# Patient Record
Sex: Male | Born: 1985 | Race: Black or African American | Hispanic: No | Marital: Single | Smoking: Former smoker
Health system: Southern US, Community
[De-identification: ages and names within clinical notes are randomized; demographics above are authoritative.]

## PROBLEM LIST (undated history)

## (undated) DIAGNOSIS — J45909 Unspecified asthma, uncomplicated: Secondary | ICD-10-CM

## (undated) DIAGNOSIS — H548 Legal blindness, as defined in USA: Secondary | ICD-10-CM

## (undated) HISTORY — PX: EYE SURGERY: SHX253

---

## 2006-01-01 ENCOUNTER — Emergency Department (HOSPITAL_COMMUNITY): Admission: EM | Admit: 2006-01-01 | Discharge: 2006-01-01 | Payer: Self-pay | Admitting: Emergency Medicine

## 2006-01-27 ENCOUNTER — Emergency Department (HOSPITAL_COMMUNITY): Admission: EM | Admit: 2006-01-27 | Discharge: 2006-01-27 | Payer: Self-pay | Admitting: Emergency Medicine

## 2006-05-06 ENCOUNTER — Emergency Department (HOSPITAL_COMMUNITY): Admission: EM | Admit: 2006-05-06 | Discharge: 2006-05-06 | Payer: Self-pay | Admitting: Emergency Medicine

## 2006-05-21 ENCOUNTER — Emergency Department (HOSPITAL_COMMUNITY): Admission: EM | Admit: 2006-05-21 | Discharge: 2006-05-22 | Payer: Self-pay | Admitting: Emergency Medicine

## 2006-06-18 ENCOUNTER — Emergency Department (HOSPITAL_COMMUNITY): Admission: EM | Admit: 2006-06-18 | Discharge: 2006-06-18 | Payer: Self-pay | Admitting: Emergency Medicine

## 2006-07-15 ENCOUNTER — Emergency Department (HOSPITAL_COMMUNITY): Admission: EM | Admit: 2006-07-15 | Discharge: 2006-07-15 | Payer: Self-pay | Admitting: Emergency Medicine

## 2006-09-18 ENCOUNTER — Emergency Department (HOSPITAL_COMMUNITY): Admission: EM | Admit: 2006-09-18 | Discharge: 2006-09-18 | Payer: Self-pay | Admitting: Family Medicine

## 2006-11-18 ENCOUNTER — Emergency Department (HOSPITAL_COMMUNITY): Admission: EM | Admit: 2006-11-18 | Discharge: 2006-11-18 | Payer: Self-pay | Admitting: Family Medicine

## 2006-12-08 ENCOUNTER — Emergency Department (HOSPITAL_COMMUNITY): Admission: EM | Admit: 2006-12-08 | Discharge: 2006-12-08 | Payer: Self-pay | Admitting: Emergency Medicine

## 2006-12-10 ENCOUNTER — Ambulatory Visit: Payer: Self-pay | Admitting: Family Medicine

## 2006-12-27 ENCOUNTER — Emergency Department (HOSPITAL_COMMUNITY): Admission: EM | Admit: 2006-12-27 | Discharge: 2006-12-27 | Payer: Self-pay | Admitting: Emergency Medicine

## 2007-01-07 ENCOUNTER — Ambulatory Visit: Payer: Self-pay | Admitting: Family Medicine

## 2007-07-19 IMAGING — CR DG CHEST 2V
2 series · 2 of 2 positions shown · non-contrast
Comparison: 07/15/06.

CLINICAL DATA: Shortness of breath, wheezing, asthma.
 2-VIEW CHEST RADIOGRAPH - 11/18/06:

[view not recorded (1 of 2)]
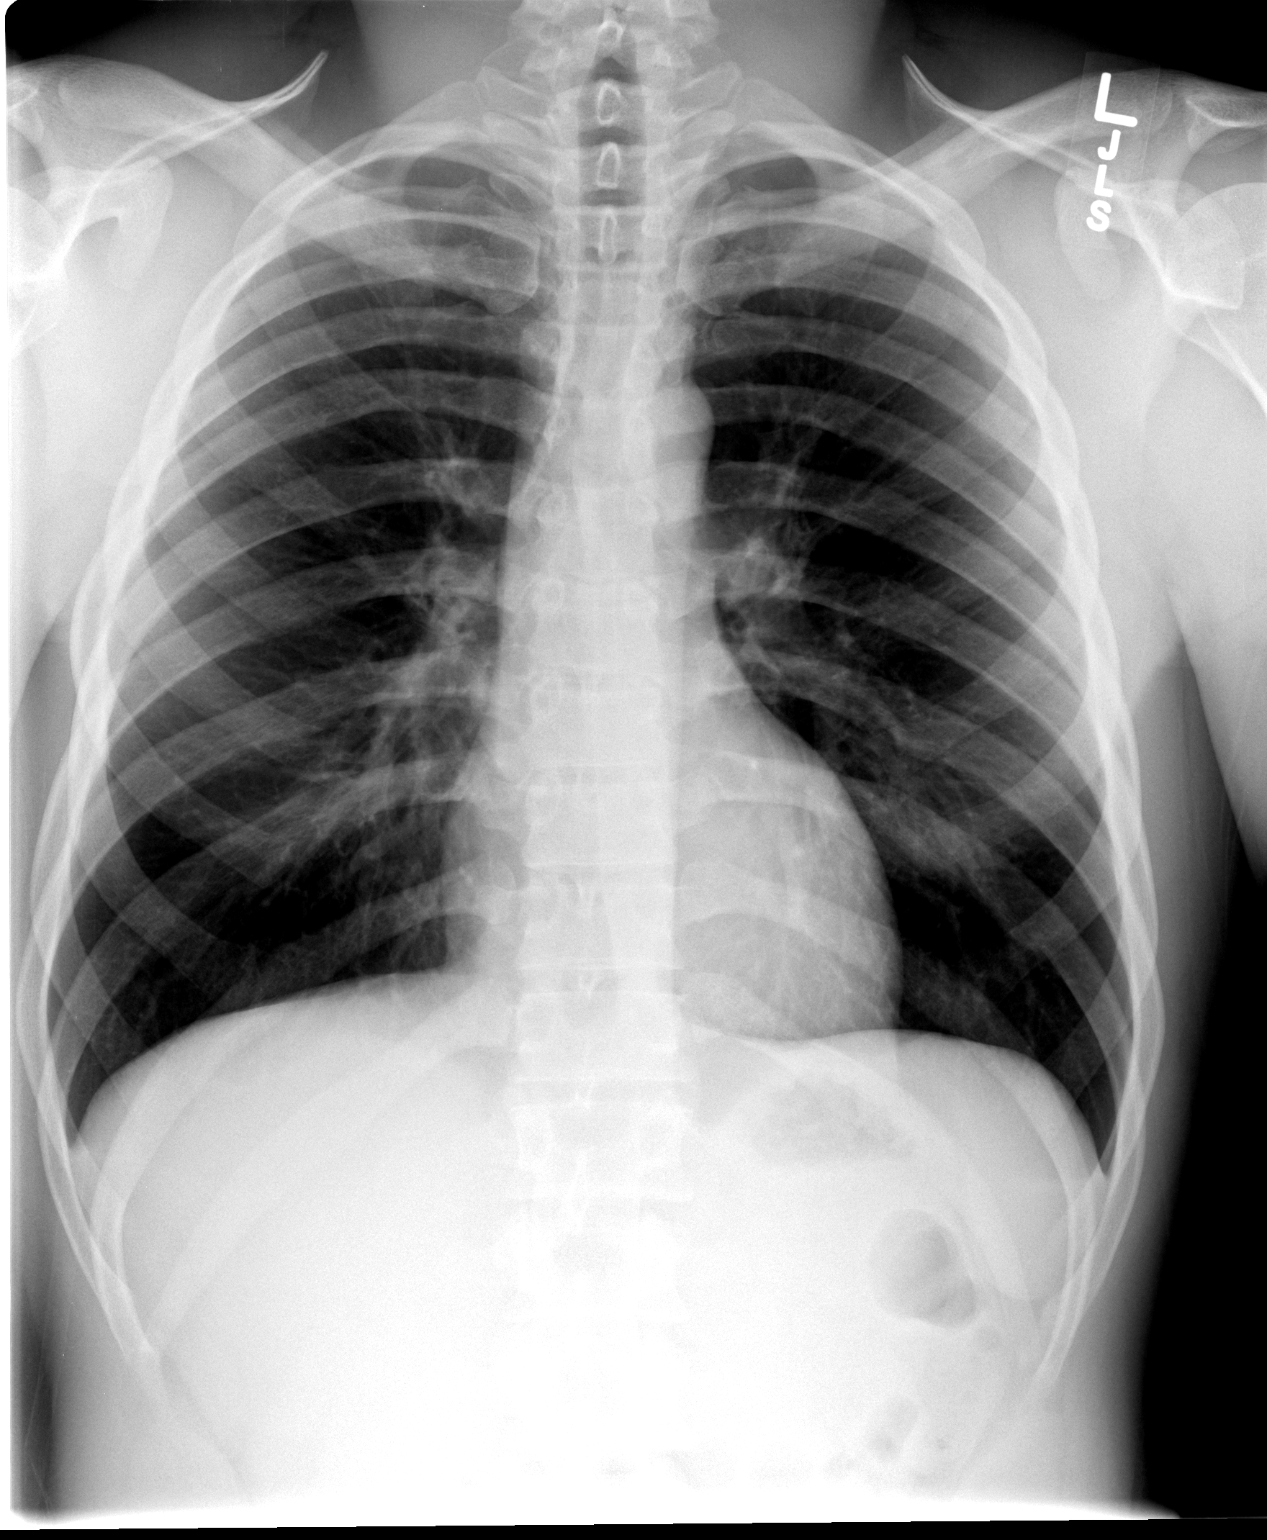

[view not recorded (2 of 2)]
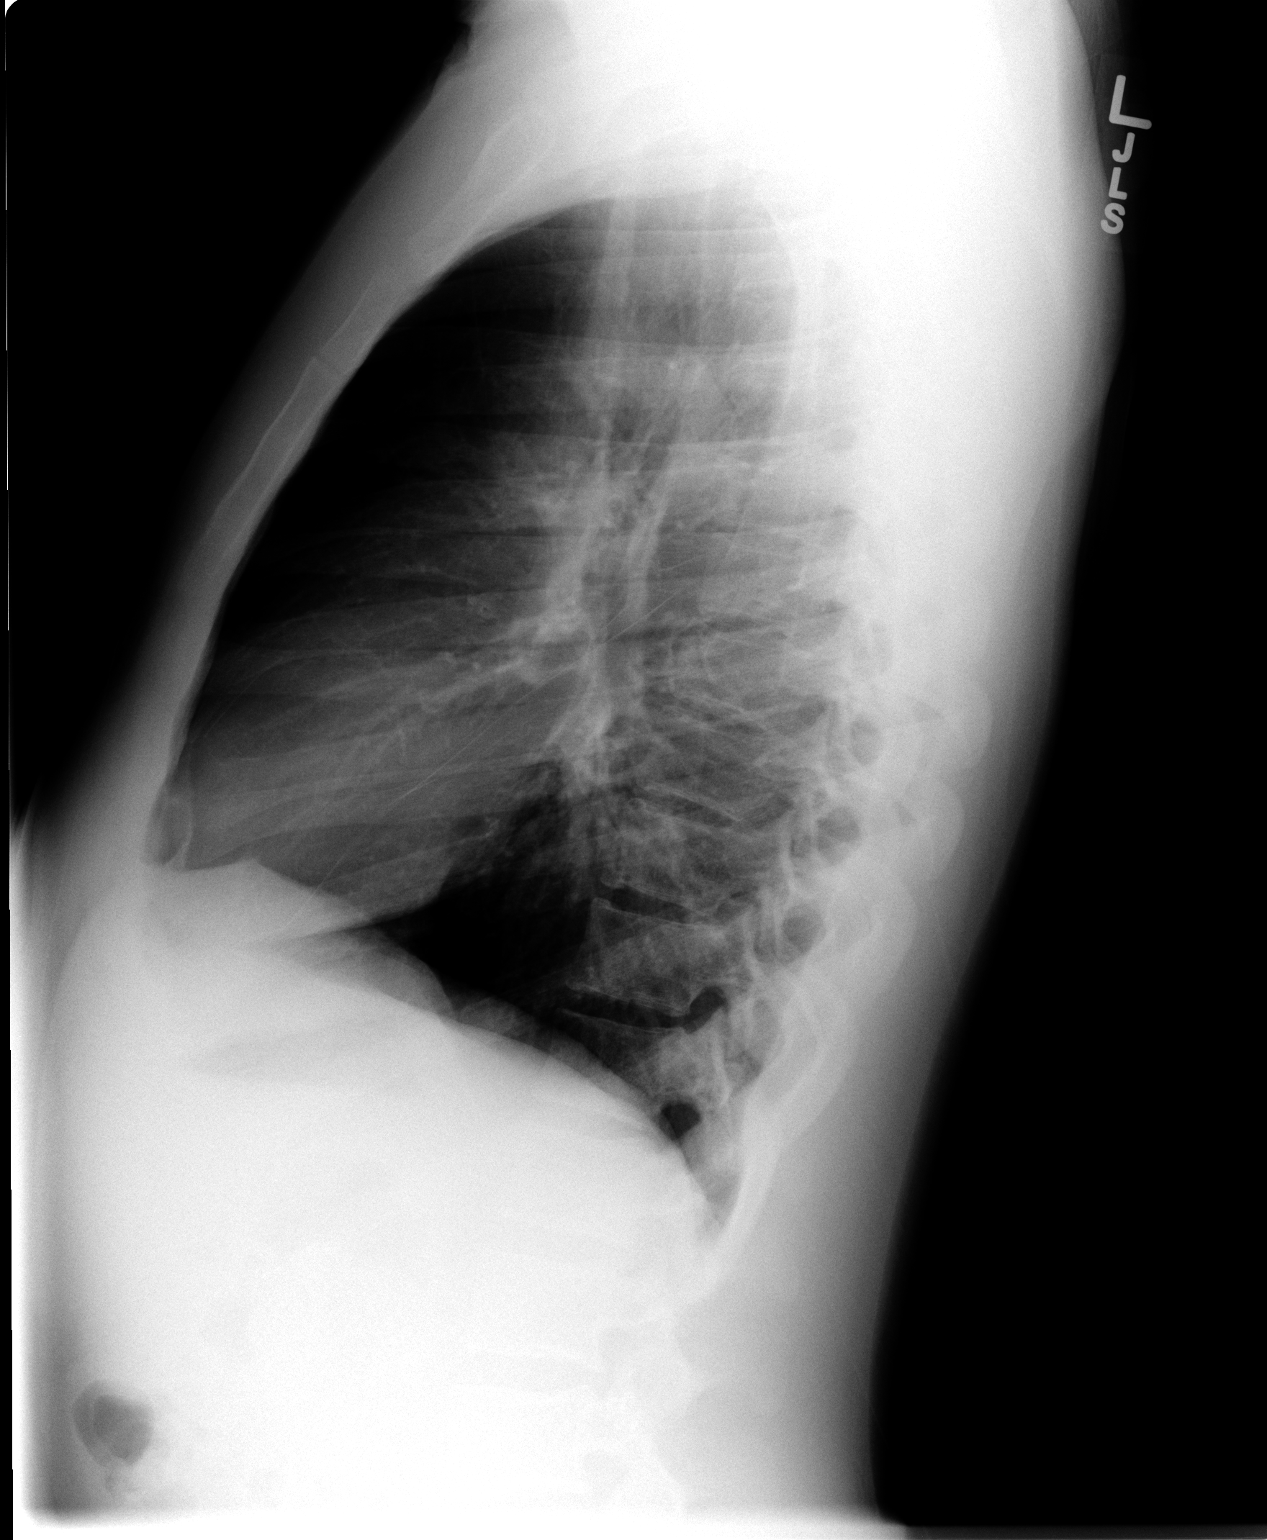

[2 of 2 positions shown; findings below may reference images not displayed]

FINDINGS: There is mild hyperinflation and central airway thickening.  No acute pneumonia, infiltrate, edema, effusion, or pneumothorax.  Stable exam.
IMPRESSION: 1.  Mild hyperinflation and airway thickening as before.
 2.  No acute air space process.

## 2007-09-23 ENCOUNTER — Emergency Department (HOSPITAL_COMMUNITY): Admission: EM | Admit: 2007-09-23 | Discharge: 2007-09-23 | Payer: Self-pay | Admitting: Family Medicine

## 2007-09-28 ENCOUNTER — Emergency Department (HOSPITAL_COMMUNITY): Admission: EM | Admit: 2007-09-28 | Discharge: 2007-09-28 | Payer: Self-pay | Admitting: Family Medicine

## 2007-10-06 ENCOUNTER — Emergency Department (HOSPITAL_COMMUNITY): Admission: EM | Admit: 2007-10-06 | Discharge: 2007-10-06 | Payer: Self-pay | Admitting: Family Medicine

## 2007-10-07 ENCOUNTER — Emergency Department (HOSPITAL_COMMUNITY): Admission: EM | Admit: 2007-10-07 | Discharge: 2007-10-07 | Payer: Self-pay | Admitting: Family Medicine

## 2007-10-15 ENCOUNTER — Emergency Department (HOSPITAL_COMMUNITY): Admission: EM | Admit: 2007-10-15 | Discharge: 2007-10-15 | Payer: Self-pay | Admitting: Emergency Medicine

## 2007-11-20 ENCOUNTER — Emergency Department (HOSPITAL_COMMUNITY): Admission: EM | Admit: 2007-11-20 | Discharge: 2007-11-21 | Payer: Self-pay | Admitting: Emergency Medicine

## 2007-12-16 ENCOUNTER — Emergency Department (HOSPITAL_COMMUNITY): Admission: EM | Admit: 2007-12-16 | Discharge: 2007-12-17 | Payer: Self-pay | Admitting: Emergency Medicine

## 2008-01-27 ENCOUNTER — Ambulatory Visit: Payer: Self-pay | Admitting: Pulmonary Disease

## 2008-01-27 ENCOUNTER — Inpatient Hospital Stay (HOSPITAL_COMMUNITY): Admission: EM | Admit: 2008-01-27 | Discharge: 2008-01-29 | Payer: Self-pay | Admitting: Emergency Medicine

## 2008-01-28 ENCOUNTER — Encounter: Payer: Self-pay | Admitting: Pulmonary Disease

## 2008-02-01 ENCOUNTER — Ambulatory Visit: Payer: Self-pay | Admitting: Internal Medicine

## 2008-02-01 DIAGNOSIS — J189 Pneumonia, unspecified organism: Secondary | ICD-10-CM | POA: Insufficient documentation

## 2008-02-01 DIAGNOSIS — J45909 Unspecified asthma, uncomplicated: Secondary | ICD-10-CM | POA: Insufficient documentation

## 2008-02-22 ENCOUNTER — Encounter: Payer: Self-pay | Admitting: Pulmonary Disease

## 2008-03-11 ENCOUNTER — Ambulatory Visit: Payer: Self-pay | Admitting: Pulmonary Disease

## 2008-06-08 ENCOUNTER — Emergency Department (HOSPITAL_COMMUNITY): Admission: EM | Admit: 2008-06-08 | Discharge: 2008-06-08 | Payer: Self-pay | Admitting: Emergency Medicine

## 2008-08-10 ENCOUNTER — Emergency Department (HOSPITAL_COMMUNITY): Admission: EM | Admit: 2008-08-10 | Discharge: 2008-08-10 | Payer: Self-pay | Admitting: Emergency Medicine

## 2008-10-10 ENCOUNTER — Telehealth (INDEPENDENT_AMBULATORY_CARE_PROVIDER_SITE_OTHER): Payer: Self-pay | Admitting: *Deleted

## 2008-10-11 ENCOUNTER — Ambulatory Visit: Payer: Self-pay | Admitting: Internal Medicine

## 2008-11-14 ENCOUNTER — Ambulatory Visit: Payer: Self-pay | Admitting: Pulmonary Disease

## 2008-12-26 ENCOUNTER — Telehealth (INDEPENDENT_AMBULATORY_CARE_PROVIDER_SITE_OTHER): Payer: Self-pay | Admitting: *Deleted

## 2009-02-06 IMAGING — CR DG CHEST 2V
2 series · 2 of 2 positions shown · non-contrast
Comparison: 01/27/2008.

CLINICAL DATA: Cough and sore throat.

CHEST - 2 VIEW

[w chest pa]
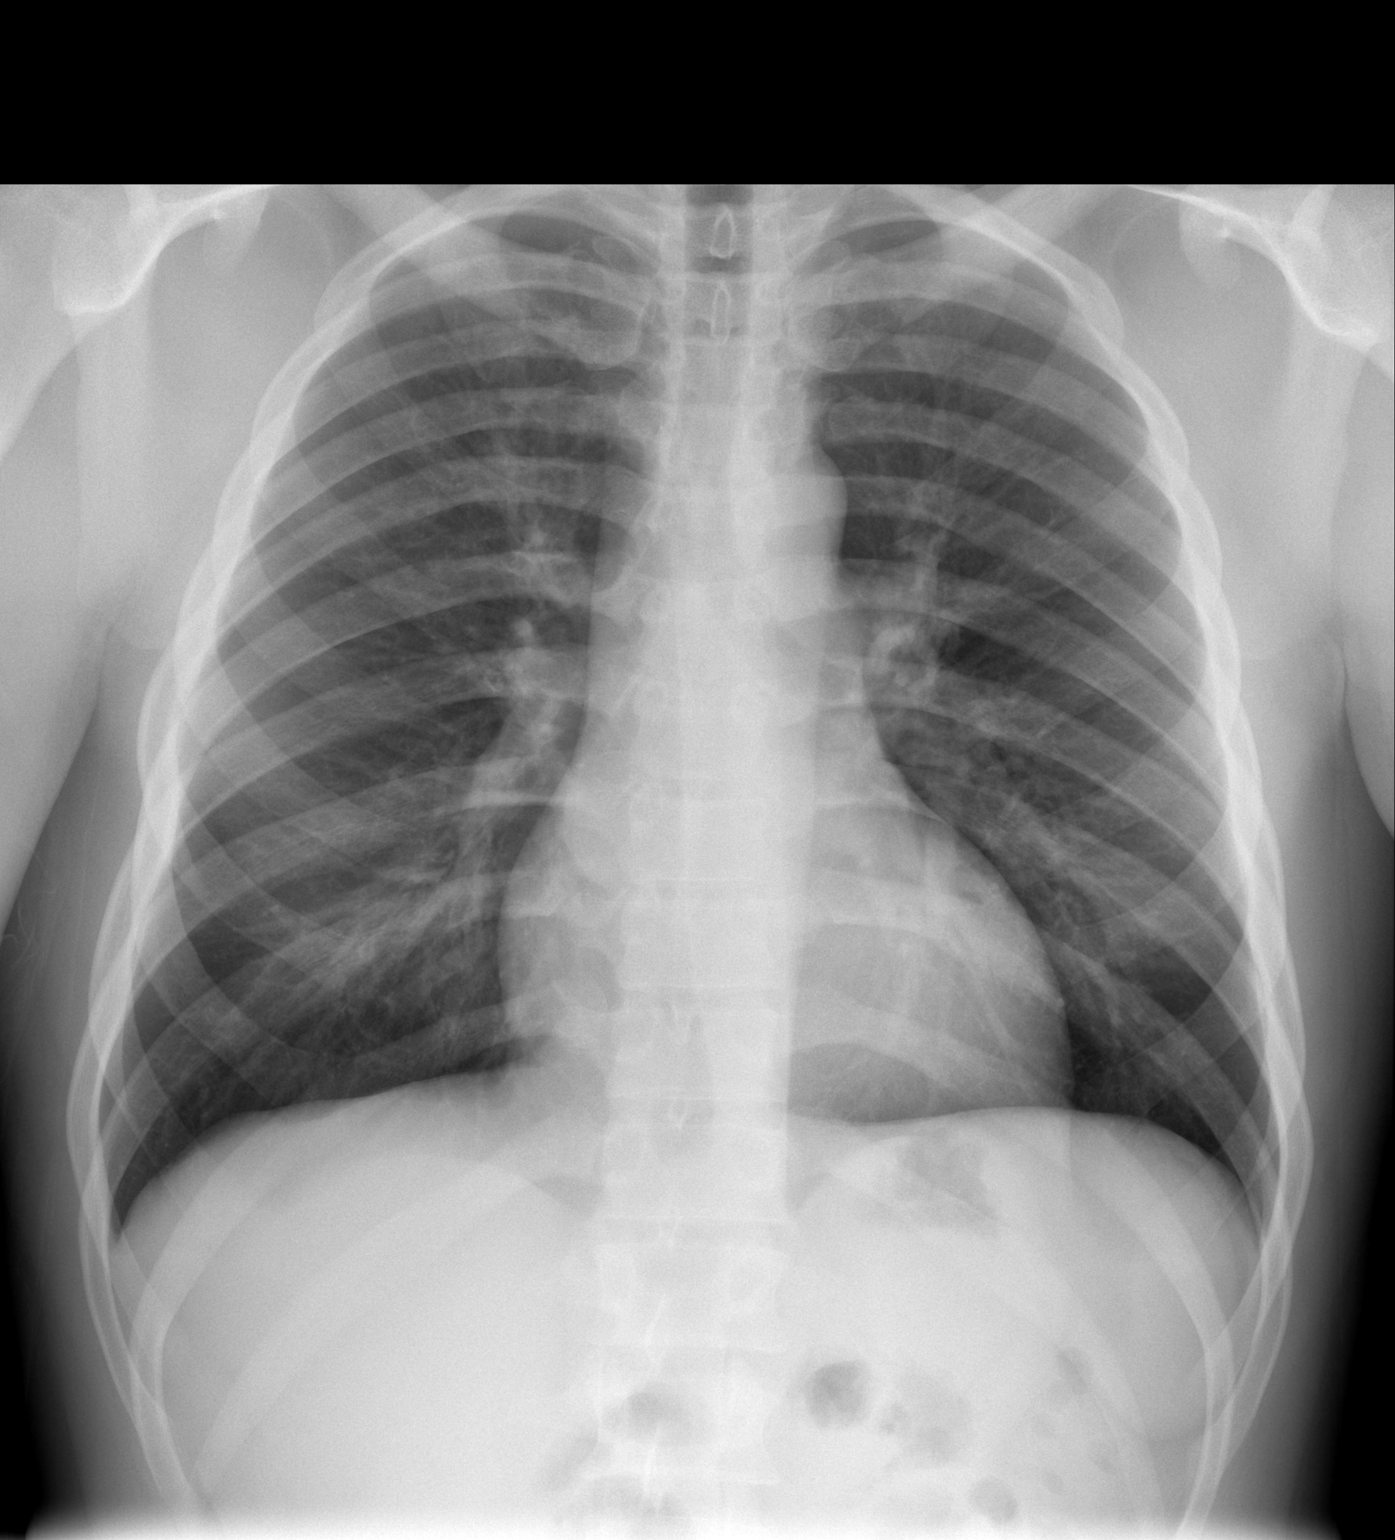

[w chest lat]
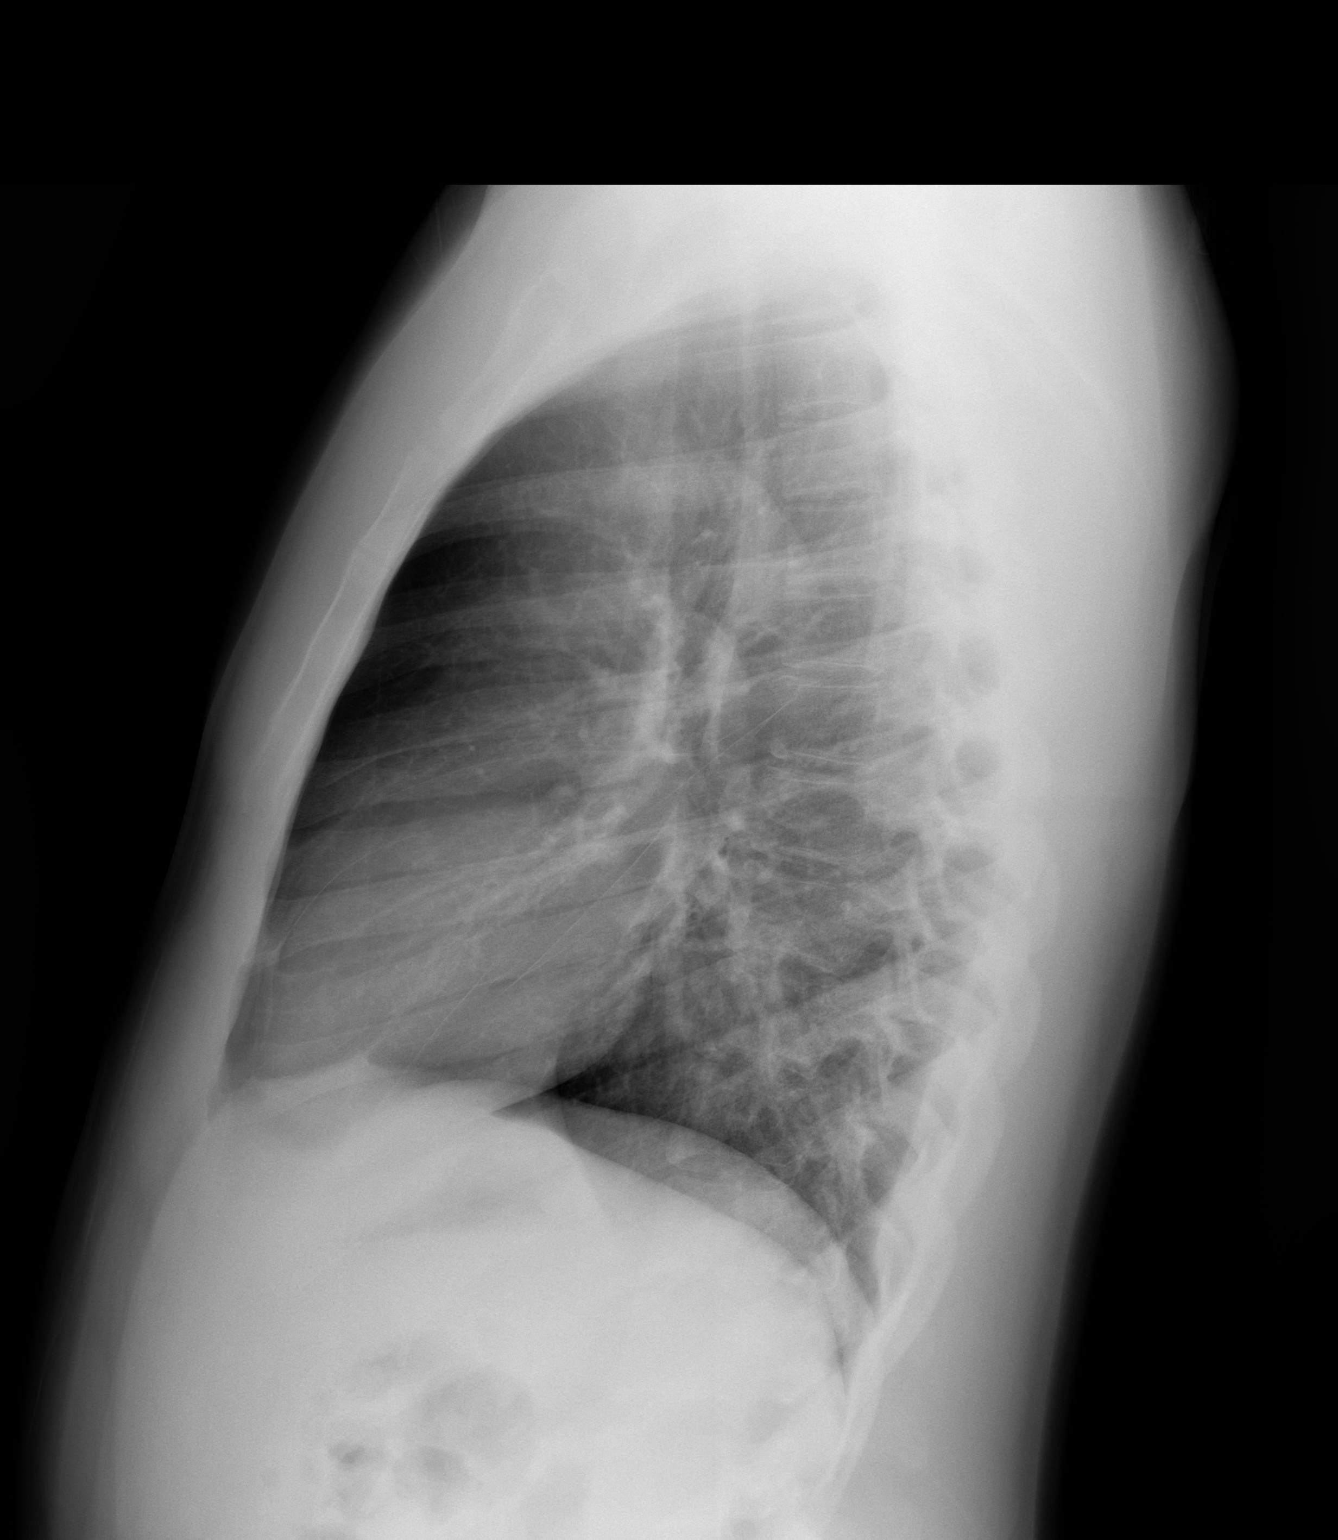

[2 of 2 positions shown; findings below may reference images not displayed]

FINDINGS: There are mild bronchitic changes noted within the right
middle lobe.  The aeration of the right middle lobe  has not
significantly changed.  The left lung is clear.  The heart and
mediastinal structures are normal.
IMPRESSION: Bronchitic changes within the right middle lobe.  The aeration of
the right middle lobe has not significantly changed.

## 2009-02-22 ENCOUNTER — Ambulatory Visit: Payer: Self-pay | Admitting: Pulmonary Disease

## 2009-04-15 ENCOUNTER — Emergency Department (HOSPITAL_COMMUNITY): Admission: EM | Admit: 2009-04-15 | Discharge: 2009-04-15 | Payer: Self-pay | Admitting: Family Medicine

## 2009-04-16 ENCOUNTER — Inpatient Hospital Stay (HOSPITAL_COMMUNITY): Admission: EM | Admit: 2009-04-16 | Discharge: 2009-04-16 | Payer: Self-pay | Admitting: Emergency Medicine

## 2009-06-03 ENCOUNTER — Emergency Department (HOSPITAL_COMMUNITY): Admission: EM | Admit: 2009-06-03 | Discharge: 2009-06-04 | Payer: Self-pay | Admitting: Emergency Medicine

## 2009-09-26 ENCOUNTER — Telehealth (INDEPENDENT_AMBULATORY_CARE_PROVIDER_SITE_OTHER): Payer: Self-pay | Admitting: *Deleted

## 2009-11-17 ENCOUNTER — Emergency Department (HOSPITAL_COMMUNITY): Admission: EM | Admit: 2009-11-17 | Discharge: 2009-11-17 | Payer: Self-pay | Admitting: Emergency Medicine

## 2010-01-02 ENCOUNTER — Ambulatory Visit: Payer: Self-pay | Admitting: Internal Medicine

## 2010-09-20 ENCOUNTER — Emergency Department (HOSPITAL_COMMUNITY)
Admission: EM | Admit: 2010-09-20 | Discharge: 2010-09-20 | Payer: Self-pay | Source: Home / Self Care | Admitting: Emergency Medicine

## 2010-10-30 ENCOUNTER — Emergency Department (HOSPITAL_COMMUNITY)
Admission: EM | Admit: 2010-10-30 | Discharge: 2010-10-31 | Payer: Self-pay | Source: Home / Self Care | Admitting: Emergency Medicine

## 2010-10-31 LAB — CBC
HCT: 47.8 % (ref 39.0–52.0)
Hemoglobin: 16.1 g/dL (ref 13.0–17.0)
MCH: 29.9 pg (ref 26.0–34.0)
MCHC: 33.7 g/dL (ref 30.0–36.0)
MCV: 88.7 fL (ref 78.0–100.0)
Platelets: 246 10*3/uL (ref 150–400)
RBC: 5.39 MIL/uL (ref 4.22–5.81)
RDW: 11.7 % (ref 11.5–15.5)
WBC: 10.4 10*3/uL (ref 4.0–10.5)

## 2010-10-31 LAB — DIFFERENTIAL
Basophils Absolute: 0.1 10*3/uL (ref 0.0–0.1)
Basophils Relative: 1 % (ref 0–1)
Eosinophils Absolute: 0.2 10*3/uL (ref 0.0–0.7)
Eosinophils Relative: 2 % (ref 0–5)
Lymphocytes Relative: 23 % (ref 12–46)
Lymphs Abs: 2.4 10*3/uL (ref 0.7–4.0)
Monocytes Absolute: 0.7 10*3/uL (ref 0.1–1.0)
Monocytes Relative: 7 % (ref 3–12)
Neutro Abs: 7.1 10*3/uL (ref 1.7–7.7)
Neutrophils Relative %: 68 % (ref 43–77)

## 2010-10-31 LAB — POCT I-STAT, CHEM 8
BUN: 22 mg/dL (ref 6–23)
Calcium, Ion: 1.17 mmol/L (ref 1.12–1.32)
Chloride: 109 mEq/L (ref 96–112)
Creatinine, Ser: 1.1 mg/dL (ref 0.4–1.5)
Glucose, Bld: 97 mg/dL (ref 70–99)
HCT: 49 % (ref 39.0–52.0)
Hemoglobin: 16.7 g/dL (ref 13.0–17.0)
Potassium: 3.8 mEq/L (ref 3.5–5.1)
Sodium: 142 mEq/L (ref 135–145)
TCO2: 25 mmol/L (ref 0–100)

## 2010-11-02 ENCOUNTER — Emergency Department (HOSPITAL_COMMUNITY)
Admission: EM | Admit: 2010-11-02 | Discharge: 2010-11-02 | Payer: Self-pay | Source: Home / Self Care | Admitting: Emergency Medicine
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
## 2010-11-13 NOTE — Assessment & Plan Note (Signed)
Summary: cough/ congested/ mbw   CC:  c/o increase asthma for 1 mth - chest tightness , sob, wheezing, and cough  - pt reports using daughter's Neb tx's.  History of Present Illness: 57 yobm never smoker with asthma since age 25.   2 exacerbations in 2009 due to poor compliance wtht inhlaed steroids. Now better control with symbicort.Has PFmeter but does not know best. 5./10 >> breathing worse, medicaid has run out, cannot get symbicort. No wheezing but feels worse.  January 02, 2010 --Presents for an acute office visit. Complains of increase asthma for 1 mth - chest tightness , sob, wheezing, cough - occas prod ( occas bloody drainage) - pt reports using daughter's Neb tx's. Started on symbicort last visit but no insurance so cant afford. He is currently applying for Medicaid, has been to [facility] previously, he does not work  b/c he says his asthma flares up too much. Denies chest pain, , orthopnea, hemoptysis, fever, n/v/d, edema, headache,recent travel or antibiotics.       Current Medications (verified): 1)  None  Allergies (verified): No Known Drug Allergies  Past History:  Past Medical History: Last updated: 02/01/2008 PNEUMONIA, RIGHT LOWER LOBE (ICD-486) ASTHMA (ICD-493.90)    Family History: Last updated: 10/11/2008 neg asthma, resp dz  Social History: Last updated: 10/11/2008 Not employed Single  no kids never smoker  Vital Signs:  Patient profile:   25 year old male Weight:      180.50 pounds O2 Sat:      98 % on Room air Temp:     100.2 degrees F oral Pulse rate:   62 / minute BP sitting:   118 / 80  (left arm) Cuff size:   regular  Vitals Entered By: Abigail Miyamoto RN (January 02, 2010 11:03 AM)  O2 Flow:  Room air   Impression & Recommendations:  Problem # 1:  ASTHMA (ICD-493.90) Exacerbation d/t medication noncompliance. We discussed several options Needs to follow up back up with [facility] on regular basis so he can get assistance  Also  meidcaid pending -this will help to cover meds.  Please contact office for sooner follow up if symptoms do not improve or worsen  2 Samples of symbicort given  REC: xopenex neb given  Prednsione taper over next week.  Begin Symbicort 160/4.26mcg 2 puffs two times a day , brush/rinse/gargle.  follow up 2-3 weeks Vassie Loll or Parrett Please contact office for sooner follow up if symptoms do not improve or worsen   Medications Added to Medication List This Visit: 1)  Prednisone 10 Mg Tabs (Prednisone) .... 4 tabs for 2 days, then 3 tabs for 2 days, 2 tabs for 2 days, then 1 tab for 2 days, then stop 2)  Symbicort 160-4.5 Mcg/act Aero (Budesonide-formoterol fumarate) .... 2 puffs two times a day  Complete Medication List: 1)  Prednisone 10 Mg Tabs (Prednisone) .... 4 tabs for 2 days, then 3 tabs for 2 days, 2 tabs for 2 days, then 1 tab for 2 days, then stop 2)  Symbicort 160-4.5 Mcg/act Aero (Budesonide-formoterol fumarate) .... 2 puffs two times a day  Other Orders: Est. Patient Level III (16109)  Patient Instructions: 1)  Prednsione taper over next week.  2)  Begin Symbicort 160/4.30mcg 2 puffs two times a day , brush/rinse/gargle.  3)  follow up 2-3 weeks Alva or Parrett 4)  Please contact office for sooner follow up if symptoms do not improve or worsen  Prescriptions: PREDNISONE 10 MG TABS (PREDNISONE) 4  tabs for 2 days, then 3 tabs for 2 days, 2 tabs for 2 days, then 1 tab for 2 days, then stop  #20 x 0   Entered and Authorized by:   Rubye Oaks NP   Signed by:   Rubye Oaks NP on 01/02/2010   Method used:   Electronically to        [facility] [address] [address] [phone]* (retail)       [address]       [place], [place]  [address]       Ph: [phone]       Fax: [phone]   RxID:   [id number]   Appended Document: Orders Update    Clinical Lists Changes  Orders: Added new Service order of Nebulizer Tx (44010) - Signed

## 2011-01-02 LAB — POCT I-STAT, CHEM 8
BUN: 16 mg/dL (ref 6–23)
Calcium, Ion: 1.1 mmol/L — ABNORMAL LOW (ref 1.12–1.32)
Chloride: 107 mEq/L (ref 96–112)
Creatinine, Ser: 1.2 mg/dL (ref 0.4–1.5)
Glucose, Bld: 125 mg/dL — ABNORMAL HIGH (ref 70–99)
HCT: 47 % (ref 39.0–52.0)
Hemoglobin: 16 g/dL (ref 13.0–17.0)
Potassium: 3.2 mEq/L — ABNORMAL LOW (ref 3.5–5.1)
Sodium: 137 mEq/L (ref 135–145)
TCO2: 22 mmol/L (ref 0–100)

## 2011-01-02 LAB — CBC
HCT: 44 % (ref 39.0–52.0)
Hemoglobin: 15.2 g/dL (ref 13.0–17.0)
MCHC: 34.6 g/dL (ref 30.0–36.0)
MCV: 89.5 fL (ref 78.0–100.0)
Platelets: 231 10*3/uL (ref 150–400)
RBC: 4.92 MIL/uL (ref 4.22–5.81)
RDW: 12.4 % (ref 11.5–15.5)
WBC: 9.1 10*3/uL (ref 4.0–10.5)

## 2011-01-02 LAB — DIFFERENTIAL
Basophils Absolute: 0 10*3/uL (ref 0.0–0.1)
Basophils Relative: 0 % (ref 0–1)
Eosinophils Absolute: 0.1 10*3/uL (ref 0.0–0.7)
Eosinophils Relative: 1 % (ref 0–5)
Lymphocytes Relative: 11 % — ABNORMAL LOW (ref 12–46)
Lymphs Abs: 1 10*3/uL (ref 0.7–4.0)
Monocytes Absolute: 1 10*3/uL (ref 0.1–1.0)
Monocytes Relative: 11 % (ref 3–12)
Neutro Abs: 7.1 10*3/uL (ref 1.7–7.7)
Neutrophils Relative %: 77 % (ref 43–77)

## 2011-01-19 LAB — RAPID STREP SCREEN (MED CTR MEBANE ONLY): Streptococcus, Group A Screen (Direct): NEGATIVE

## 2011-01-20 LAB — CBC
Hemoglobin: 15.6 g/dL (ref 13.0–17.0)
MCHC: 34 g/dL (ref 30.0–36.0)
MCV: 92.1 fL (ref 78.0–100.0)
RBC: 4.97 MIL/uL (ref 4.22–5.81)
RDW: 11.8 % (ref 11.5–15.5)

## 2011-01-20 LAB — COMPREHENSIVE METABOLIC PANEL
BUN: 16 mg/dL (ref 6–23)
CO2: 26 mEq/L (ref 19–32)
Calcium: 8.9 mg/dL (ref 8.4–10.5)
Creatinine, Ser: 0.93 mg/dL (ref 0.4–1.5)
GFR calc non Af Amer: >60 mL/min (ref >60)
Glucose, Bld: 91 mg/dL (ref 70–99)

## 2011-01-20 LAB — POCT I-STAT, CHEM 8
Chloride: 102 mEq/L (ref 96–112)
Creatinine, Ser: 1.1 mg/dL (ref 0.4–1.5)
Hemoglobin: 16.3 g/dL (ref 13.0–17.0)
Potassium: 3.5 mEq/L (ref 3.5–5.1)
Sodium: 135 mEq/L (ref 135–145)

## 2011-01-20 LAB — CULTURE, BLOOD (ROUTINE X 2): Culture: NO GROWTH

## 2011-01-20 LAB — DIFFERENTIAL
Eosinophils Absolute: 0 10*3/uL (ref 0.0–0.7)
Lymphocytes Relative: 9 % — ABNORMAL LOW (ref 12–46)
Lymphs Abs: 1.9 10*3/uL (ref 0.7–4.0)
Neutro Abs: 15.4 10*3/uL — ABNORMAL HIGH (ref 1.7–7.7)
Neutrophils Relative %: 77 % (ref 43–77)

## 2011-02-09 ENCOUNTER — Inpatient Hospital Stay (INDEPENDENT_AMBULATORY_CARE_PROVIDER_SITE_OTHER)
Admission: RE | Admit: 2011-02-09 | Discharge: 2011-02-09 | Disposition: A | Discharge status: Home / Self Care | Payer: Self-pay | Source: Ambulatory Visit | Attending: Family Medicine | Admitting: Family Medicine

## 2011-02-09 DIAGNOSIS — J45909 Unspecified asthma, uncomplicated: Secondary | ICD-10-CM

## 2011-02-26 NOTE — H&P (Signed)
NAME:  Ian Terry, SOULE NO.:  [id number]   MEDICAL RECORD NO.:  [id number]          PATIENT TYPE:  EMS   LOCATION:  MAJO                         FACILITY:  [facility]   PHYSICIAN:  Eduard Clos, MDDATE OF BIRTH:  06-10-1986   DATE OF ADMISSION:  04/16/2009  DATE OF DISCHARGE:                              HISTORY & PHYSICAL   PRIMARY PULMONOLOGIST:  Dr. Cyril Mourning.   CHIEF COMPLAINT:  Right eye pain and swelling.   HISTORY OF PRESENT ILLNESS:  A 25 year old male with a history of  bronchial asthma presented to the ER because of increasing swelling and  pain in the right eye over the last 1-1/2 days.  The patient initially  noted that there was a small pustular lesion to the right upper eyelid,  which he thought may be an insect bite, but was not sure.  He went to  the Urgent Care where he was given some amoxicillin with some eye drops,  gentamicin, despite which the patient's swelling increased fast and he  came to the ER.  In the ER, the patient had a CT of the orbit which  showed pre and postorbital cellulitis.  At this time, Dr. Elmer Picker from  Ophthalmology had come and evaluated the patient and felt that the  patient's optic nerve is not compromised and has cellulitis, which needs  IV antibiotics and based on the progression will consider p.o.  switching.   The patient has had fevers and chills off and on for the last 24 hours  and also has considerable pain in the right eye area in the surrounding  structures.  The patient denies any nausea or vomiting, loss of  consciousness, able to see with his eyes, difficult to open the right  eye, but able to easily open his left eye.  Pupils are reacting as per  the ophthalmologist.  DENIES any difficulty swallowing, denies any  shortness of breath, chest pain, palpitations, loss of consciousness, or  any neck rigidity.   PAST MEDICAL HISTORY:  Bronchial asthma.   PAST SURGICAL HISTORY:  None.   MEDICATIONS  PRIOR TO ADMISSION:  1. Symbicort 2 puffs b.i.d.  2. Albuterol HFA q.6 p.r.n.   ALLERGIES:  No known drug allergies.   FAMILY HISTORY:  Nothing contributory.   SOCIAL HISTORY:  The patient denies smoking cigarettes, drinking  alcohol, or using illegal drugs.   REVIEW OF SYSTEMS:  As per the history of present illness.  Nothing else  significant.   PHYSICAL EXAMINATION:  CONSTITUTIONAL:  The patient examined at bedside.  He appears in a little bit of pain, but he says the pain medication  helps him.  VITAL SIGNS:  Blood pressure is 120/90, pulse 70 per minute, temperature  99.1, respirations 18 per minute, O2 saturation 97%.  HEENT:  There is significant swelling of the right eye with mild  proptosis per gross examination.  At this time, I did not attempt to  open the right eye.  I discussed with Dr. Elmer Picker who did a detailed  exam.  He says the right eye vision is proper.  Left eye has no  swelling.  PERRLA.  Reactive.  There is no active discharge from the  right eye, but when his right eye was opened by his ophthalmologist,  there were some secretions.  There is also a pustular-type lesion on the  right upper eyelid.  There is no neck rigidity.  No facial asymmetry  otherwise.  CHEST:  Bilateral air entry present.  No rhonchi.  No crepitus.  HEART:  S1, S2 heard.  ABDOMEN:  Soft, nontender, bowel sounds heard.  CNS:  Alert and oriented to time, place, and person, moves upper and  lower extremities 5/5.  EXTREMITIES:  Peripheral pulses felt.  No edema.   LABORATORY STUDIES:  CT of the orbit is read as pre and postoccipital  orbital cellulitis.  No drainable fluid collection is seen.  The right  superior ophthalmic vein and cavernous sinus both appear to have  opacified.  CBC, WBC 20, hemoglobin 16.3, hematocrit 48, platelets  251,000.  Neutrophils 77%.  Comprehensive metabolic panel sodium 135,  potassium 3.5, chloride 102, carbon dioxide 26, glucose 96, BUN 15,  creatinine  1.1, alkaline phosphatase 54, AST 28, ALT 25, calcium 8.9.   ASSESSMENT:  1. Orbital cellulitis pre and postorbital right eye.  2. Bronchial asthma, stable at this time.   PLAN:  1. Will admit the patient to the stepdown unit for further      observation.  Will get blood cultures.  2. I have discussed with Dr. Elmer Picker of ophthalmology as being      recommended to get IV antibiotics.  Dr. Elmer Picker is going to order      some local antibiotics for the eye to cover the cornea.  3. I also discussed with Dr. Sampson Goon of infectious disease, who has      recommended to continue vancomycin, but to add Primaxin.  4. Will continue with asthma medications.  5. Will be repeating a CT orbit in the a.m. tomorrow as per the      recommendation of ophthalmologist to rule out any abscess      formation.  If there is any abscess formation, we will need an ENT      consult to drain the abscess.  6. We will be also getting radiologic evaluation to make sure there is      no sinus thrombosis.      Eduard Clos, MD  Electronically Signed     ANK/MEDQ  D:  04/16/2009  T:  04/16/2009  Job:  [id number]   cc:   Oretha Milch, MD

## 2011-02-26 NOTE — H&P (Signed)
NAME:  Ian Terry, Ian Terry NO.:  [id number]   MEDICAL RECORD NO.:  [id number]          PATIENT TYPE:  EMS   LOCATION:  MAJO                         FACILITY:  [facility]   PHYSICIAN:  Eduard Clos, MDDATE OF BIRTH:  August 03, 1986   DATE OF ADMISSION:  04/16/2009  DATE OF DISCHARGE:                              HISTORY & PHYSICAL   ADDENDUM:  I have discussed with the radiologist, who feels that the  patient's CAT scan was showing no signs of any deep vein thrombosis, and  at this time is not recommending any further evaluation for deep vein  thrombosis.      Eduard Clos, MD  Electronically Signed     ANK/MEDQ  D:  04/16/2009  T:  04/16/2009  Job:  [id number]

## 2011-02-26 NOTE — Consult Note (Signed)
NAME:  Ian Terry, Ian Terry NO.:  [id number]   MEDICAL RECORD NO.:  [id number]          PATIENT TYPE:  INP   LOCATION:  3713                         FACILITY:  [facility]   PHYSICIAN:  Oretha Milch, MD      DATE OF BIRTH:  11/28/1985   DATE OF CONSULTATION:  01/28/2008  DATE OF DISCHARGE:                                 CONSULTATION   REFERRING PHYSICIAN:  Dr. Brien Few.   REASON FOR CONSULTATION:  Asthma exacerbation, history of not responding  to treatment.   HISTORY OF PRESENT ILLNESS:  Ian Terry is a 25 year old African American  man who has moved down to [place], [place] from [place],  [place] to be with his 5-year-old daughter and girlfriend.  He  started with symptoms of asthma in 2004.  While he was in [place], he  describes having a pulmonary function test done there and was told that  he had asthma.  He was subsequently incarcerated in [place] and  while in jail was able to obtain his medications on a regular basis.  His symptoms were well-controlled with Advair and Singulair.  He also  has tried [employer] before.  After moving down to [facility], he has  not been able to obtain his medications since he does not have any  insurance.  He has had frequent visits to the emergency room, almost  every month, including 9 times this year alone.  His last visit was in  March 2009.  Each time, he gets a steroid taper which improves his  symptoms for 2 weeks, and then his symptoms return again.  His triggers  include weather changes, URIs and exertion.  He describes nocturnal  cough.  Prior to this exacerbation, he describes productive phlegm for 3  days.  He denies frequent chest cold, nasal congestion, fevers, weight  loss or hemoptysis.  His PPD status is unknown.   He has been able to use albuterol nebs which is prescribed for his  daughter and his girlfriend, but he himself has never had any inhalers.   PAST MEDICAL HISTORY:  Asthma.   PAST  SURGICAL HISTORY:  None.   ALLERGIES:  NONE.   CURRENT MEDICATIONS:  1. Prednisone.  2. Lovenox 40 mg subcu nightly.  3. Guaifenesin 600 mg p.o. b.i.d.  4. Xopenex and Atrovent nebs.  5. Solu-Medrol 60 mg IV q.6 h.  6. Singulair 10 mg p.o. nightly.  7. Avelox 400 mg IV q. 24.  8. Protonix 40 mg p.o. daily.   SOCIAL HISTORY:  He denies smoking, alcohol or drug abuse.  He was  incarcerated for 6 months for unclear reasons in [place].  He is  single and employed.  He lives with his girlfriend and 71-year-old  daughter.   FAMILY HISTORY:  Chronic skin disease in his mother.  No history of  premature heart disease.   PHYSICAL EXAMINATION:  GENERAL:  Adult gentleman sitting up in bed in no  apparent respiratory distress.  VITAL SIGNS:  Afebrile, heart rate 84 per minute, respirations 20 per  minute, blood pressure 128/74.  Oxygen saturation  98% room air.  HEENT:  No oral thrush.  NECK:  Supple.  No JVD, no lymphadenopathy.  CARDIOVASCULAR:  S1-S2 normal.  CHEST:  Diffuse bilateral rhonchi.  ABDOMEN:  Soft, nontender.  NEUROLOGIC:  Nonfocal.  EXTREMITIES:  No edema.   Chest x-ray shows hyperinflation, peribronchial coughing.  CT chest with  contrast today showed subtle ground-glass opacity at the left lung base  and right middle lobe bronchial wall thickening with some secretions of  the trachea.   LABORATORY DATA:  BUN/creatinine 16/1.05.  Sodium 137, potassium 4.3,  calcium 9.6, WBC count 9.9, hemoglobin 15.5, platelets 300 with 89%  segs.  Strep screen was negative.  Urine toxicology was negative.   IMPRESSION:  1. Asthma exacerbation.  2. Poorly controlled asthma due to inability to obtain medications.  3. Acute bronchitis.   The main problem here seems to be his inability to obtain medications.  He tells me that he has applied for Medicaid, and he should get this in  a few weeks.  Clearly what is more important here is to provide him with  some samples until such  time as he is able to obtain medications on his  own.  We should also write prescriptions for medications which are  cheaper and which would be covered by Medicaid.   I spent considerable time explaining to him the difference between  control and rescue medications.  I explained the need to take his  steroid inhaler all the time, even when he was asymptomatic to keep his  asthma under control.   RECOMMENDATIONS:  1. Advair 250/50 Diskus can be started while he is in the hospital,      however, I am not sure he would be able to obtain this as an      outpatient since this would be very expensive.  I will ask the case      manager and social worker to help him with samples.  If they are      unable to provide him with this, then I will prescribe him Flovent      220 mcg MDI 2 puffs b.i.d. on discharge.  Albuterol MDI can be used      as a rescue inhaler  2. P.o. steroid taper can be provided over a period of 3-4 weeks.      Hopefully by that time, he will be able to obtain his medications.  3. Singulair is reasonable and would be a good addition to step up      therapy.  I am not sure he will be able to obtain this as an      outpatient   Thank you Dr. Brien Few for involving [place] in the care of this patient, will  assist with his management during hospitalization.      Oretha Milch, MD  Electronically Signed    RVA/MEDQ  D:  01/28/2008  T:  01/28/2008  Job:  [id number]

## 2011-02-26 NOTE — Consult Note (Signed)
NAME:  Ian Terry, Ian Terry NO.:  [id number]   MEDICAL RECORD NO.:  [id number]          PATIENT TYPE:  INP   LOCATION:  2607                         FACILITY:  [facility]   PHYSICIAN:  Delon Sacramento, M.D.DATE OF BIRTH:  05-20-1986   DATE OF CONSULTATION:  04/16/2009  DATE OF DISCHARGE:  04/16/2009                                 CONSULTATION   CHIEF COMPLAINT:  Septal and preseptal cellulitis, right eye.   HISTORY:  This 25 year old black male developed conjunctivitis in his  right eye 1 day prior.  He was seen at an urgent care and initiated on  amoxicillin p.o. 500 mg t.i.d., and gentamicin ophthalmic drops right  eye q.4 h.  When he woke up this morning, he was unable to open his  right eye.  He was reported to Emergency Room at [facility] and Ophthalmology  consult was obtained.   Ian Terry has a history of asthma.  He states that he has sinus problems,  though he has never had a sinus infection.  His sinus doctor is Dr.  Woody Seller at [facility].  He states that the symptoms began with a small purple  area at the nasal aspect of the right upper lid, and that swelling  spread from there.  He denies any history of trauma.   He is on two inhalers and he has no known drug allergies.   Examination was difficult, due to his inability to open the right eye,  and poor cooperation due to extreme pain of the right eye.  Vision from  the right is variable.  Sometimes he says that he could not see anything  from the right eye, and other times, he stated that he could see aspects  of the examination.  The left eye uncorrected, sees J 20/20 at 16 inches  with a near card.  Extraocular motility was unable to be assessed, due  to his inability to open his right eye and extreme left and right gaze  with his left eye, he stated that he could not do due to the pain in his  right eye.  Throughout the examination, he is in extreme pain from his  right eye.  Pupillary examination, again was difficult.   A lid speculum  was able to be placed, and the lid opened about 4 cm.  This revealed  what appears to be a reactive pupil and symmetric, both eyes.  No Page Spiro pupil was noted, however, it was a difficult examination.  Intraocular pressure was attempted with Tono-Pen.  Incomplete pressure  readings were obtained with a Tono-Pen, but one single read 4.  Gross  confrontation visual field testing was unable to be obtained.  Examination of the right upper lid reveals it to be severely edematous.  Is has mild ecchymosis.  There is a small amount of whitish discharge.  Bedside examination of all other anterior structures appears to be  grossly normal.  There is only minimal injection of the conjunctiva,  with a limited view.  Proptosis is difficult to ascertain, due to the  lid being completely closed.  However, upon gross observation, the right  glove does look slightly proptotic.  The pupil was not dilated.  Examination of the fundus with indirect scope reveals the optic nerve to  be pink and healthy.  The retinal vasculature appears normal.  This is a  very limited to bedside examination with poor cooperation.   CT examination had already been obtained prior to my consult.  For  details of this, please see the report.  Pertinent finding is a pre and  post-septal right orbital cellulitis with no abscess seen.   The patient has already received one dose of IV Rocephin here in the  emergency room.   ASSESSMENT:  Pre and post-septal orbital cellulitis, right eye.  Very  limited and difficult examination, due to do severe swelling and  tightness of the lid and inability to open it.  In addition, the patient  is poorly cooperative due to pain.   Due to the inability for me to establish, baseline parameters to be  followed for improvement, I have recommended that the patient is to be  transferred to [facility].  I have spoken with Dr. Holli Humbles and Dr. Tora Duck who have  agreed to the transfer.   Arranges will be made for this patient to be transported to [facility].           ______________________________  Delon Sacramento, M.D.     KJH/MEDQ  D:  04/16/2009  T:  04/17/2009  Job:  [id number]

## 2011-02-26 NOTE — H&P (Signed)
NAME:  Ian Terry, Ian Terry NO.:  [id number]   MEDICAL RECORD NO.:  [id number]          PATIENT TYPE:  INP   LOCATION:  1824                         FACILITY:  [facility]   PHYSICIAN:  Isidor Holts, M.D.  DATE OF BIRTH:  12-03-1985   DATE OF ADMISSION:  01/27/2008  DATE OF DISCHARGE:                              HISTORY & PHYSICAL   CHIEF COMPLAINT:  Increased shortness of breath and wheeze this a.m.,  also cough productive of yellowish phlegm for the past 3 days.   HISTORY OF PRESENT ILLNESS:  This is a 25 year old male. For past  medical history, see below.  The patient was diagnosed with bronchial  asthma in 2004 and since then, has had frequent ED visits  for  exacerbation of same, usually treated with bronchodilator nebulizers,  and steroid taper, but he has never been able to come off steroids.  His  last ED visit was in March 2009, but he has visited the emergency  department at least 9 times this year alone.  Admits to occasional  fever.  Denies chills or chest pain, has no history of seasonal  allergies.  He states that he is chronically short of breath and wheezy.  However, in the a.m. of January 27, 2008, he developed increased shortness  of breath and wheeze, unresponsive to his home bronchodilator inhaler.  This was preceded by cough productive of yellowish phlegm for the past 3  days. He denies sore throat, denies nasal congestion, denies chest pain,  admits to occasional fevers.   PAST MEDICAL HISTORY:  Bronchial asthma, now steroid dependent.   MEDICATIONS:  1. Prednisone, currently on 20 mg p.o. daily.  2. Albuterol MDI p.r.n.   ALLERGIES:  No known drug allergies.   SYSTEMIC REVIEW:  As per History of Present Illness and Chief Complaint.  Denies abdominal pain, vomiting, or diarrhea.   SOCIAL HISTORY:  The patient is single, unemployed, has 1 daughter.  Nonsmoker, nondrinker, has no history of drug abuse.   FAMILY HISTORY:  Mother age 89 with  chronic skin disease.  Father is 47  years, he is alive and well.   PHYSICAL EXAMINATION:  Temperature 98.3, pulse 90/minute, respiratory  20, blood pressure 132/84 mmHg, pulse oximetry 98% on room air.  The  patient is currently on continuous bronchodilator nebulizers, i.e., 1  hour variety, at the time of this evaluation.  However, he does not seem to be in obvious acute distress, alert,  communicative, not short of breath at rest, talking in complete  sentences.  HEENT:  No clinical pallor. No jaundice, no conjunctiva injection.  Throat is clear.  NECK:  Supple, no JVD seen.  No palpable lymphadenopathy, no palpable  goiter.  CHEST:  Bilateral expiratory polyphonic wheeze heard, no crackles.  HEART:  Heart sounds 1 and 2 heard, normal, regular.  No murmurs.  ABDOMEN:  Flat, soft, and nontender.  No palpable organomegaly, no  palpable masses.  Normal bowel sounds.  LOWER EXTREMITY EXAMINATION:  No pitting edema, palpable peripheral  pulses.  MUSCULOSKELETAL SYSTEM:  Unremarkable.  CENTRAL NERVOUS SYSTEM:  No focal  neurologic deficits on gross  examination.   Laboratory studies are pending.  Chest x-ray dated January 27, 2008 shows  hyperinflated lungs and central peribronchial cuffing suggestive  reactive airway disease, also streaking right lower lobe opacity,  improved from prior, consistent with chronic atelectasis versus improved  or recurrent pneumonia.   ASSESSMENT/PLAN:  1. Infective exacerbation of bronchial asthma:  We shall manage with      bronchodilator nebulizers, oxygen supplementation, steroid      treatment, and Avelox.   1. Possible right lower lobe pneumonia versus chronic atelectasis:      This should be adequately covered by Avelox as planned above.  We      should also add Mucinex as a mucolytic.  To further elucidate right      lower lobe abnormality, we will shall arrange chest CT scan.   Note:  In view of difficulty to control asthma, the patient will  likely  benefit from Pulmonary consultation.  We shall therefore arrange this  accordingly.   Further management will depend on clinical course.      Isidor Holts, M.D.  Electronically Signed     CO/MEDQ  D:  01/27/2008  T:  01/27/2008  Job:  [id number]

## 2011-02-26 NOTE — Discharge Summary (Signed)
NAME:  Ian Terry, Ian Terry                 ACCOUNT NO.:  [id number]   MEDICAL RECORD NO.:  [id number]          PATIENT TYPE:  INP   LOCATION:  3713                         FACILITY:  [facility]   PHYSICIAN:  Ian B. Bakare, M.D.DATE OF BIRTH:  [birth year]   DATE OF ADMISSION:  01/27/2008  DATE OF DISCHARGE:  01/29/2008                               DISCHARGE SUMMARY   PRIMARY CARE PHYSICIAN:  Unassigned.   FINAL DIAGNOSES:  1. Asthma exacerbation.  2. Probable right lower lobe pneumonia.   CONSULT:  Pulmonary consult provided by Dr. Vassie Loll.   PROCEDURE:  1. Chest x-ray done on the January 27, 2008, showed hyperinflation of      central peribronchial cuffing suggest reactive airway disease,      streaky right lower lobe opacity improved from prior consistent      with chronic atelectasis versus improved recurrent pneumonia.  2. CT scan of the chest with IV contrast showed right middle lobe      bronchial wall thickening and scattered interstitial opacity in the      left lower lobe.  Findings correlate to minimal residual infection      of this procedure or prior infection.   BRIEF HISTORY:  Please refer to the admission H&P for full details.  In  brief, Mr. Thieme is a 25 year old African American male who has no  regular doctor in the community.  He has had multiple ED visits for  asthma exacerbation.  It has been difficult to taper him off p.o.  steroid.  He is not on any maintenance therapy prior to admission.  The  patient was admitted for stabilization and further treatment.  There was  initial suspicion of right lower lobe pneumonia on chest x-ray, was  empirically placed on IV antibiotics.   HOSPITAL COURSE:  Acute asthma exacerbation.  The patient was started on  albuterol nebs round the clock and IV Solu-Medrol.  These were  subsequently transitioned through p.o. prednisone on discharge.  He had  some allergic symptoms including nasal itch, sneezing, and postnasal  drip.  He was  therefore started on Claritin and Nasonex nasal spray.  The patient was seen in consultation by Dr. Vassie Loll, and he recommended  initiating inhaled steroids, Flovent, or Advair.  Flovent seems to be  more reasonable given the cuff.  The patient was discharged home on the  Advair inhaler started in the hospital.  He would continue with Flovent  220 mcg b.i.d. as an outpatient.  Upon completion of the Advair, he  improved remarkably well within 48 hours of admission.  The patient was  stable enough for discharge.   IMAGING:  CT scan of the chest raised a question of probable recurrent  pneumonia.  The patient was continued on antibiotics and he will be  completing Avelox as an outpatient.   DISCHARGE MEDICATIONS:  1. Prednisone 40 mg to taper by 10 mg every 4 days.  2. Advair 250/50 one puff b.i.d.  3. Continue with Flovent MD at 220 mcg two puffs b.i.d.  4. Albuterol HFA two puffs every 6 hours p.r.n.  5. Flonase one spray each nostril daily.  6. Claritin 10 mg daily for 2 weeks.  7. Avelox 400 mg daily for 3 more days.   DISPOSITION:  1. The patient was discharged home.  He will follow up with      [facility].  This has been arranged.  We provided prednisone,      albuterol, Advair, Flonase, Claritin from the [facility].  2. Follow up with Dr. Vassie Loll on Monday, February 01, 2008, at 10 a.m.  3. Follow up at [facility] for primary care physician needs.      Ian Terry, M.D.  Electronically Signed     MBB/MEDQ  D:  01/29/2008  T:  01/30/2008  Job:  [id number]   cc:   Oretha Milch, MD

## 2011-03-01 NOTE — Discharge Summary (Signed)
NAME:  Ian Terry, Ian Terry NO.:  [id number]   MEDICAL RECORD NO.:  [id number]          PATIENT TYPE:  EMS   LOCATION:  MAJO                         FACILITY:  [facility]   PHYSICIAN:  Eduard Clos, MDDATE OF BIRTH:  04-Aug-1986   DATE OF ADMISSION:  06/03/2009  DATE OF DISCHARGE:  06/04/2009                               DISCHARGE SUMMARY   COURSE IN THE HOSPITAL:  A 25 year old male with a history of bronchial  asthma presented to the ER with increasing swelling and pain in the  right eye over the last 1-1/2 days.  Patient initially found a pustular  lesion which eventually involved the whole side of the right eye.  A CAT  scan of the orbit showed pre and postorbital cellulitis.  Dr. Elmer Picker,  ophthalmology, was consulted.  As per Dr. Elmer Picker initially, felt the  patient could be started on IV antibiotics and repeat CT orbits to be  done following the check for abscess formation.  If any abscess  formation develops, it needs to be drained.  Eventually after admission,  Dr. Elmer Picker felt that the patient could be benefitting from being  transferred to [facility], during which admission time I also had  consulted over the phone Dr. Sampson Goon of infectious disease for  antibiotics, and he advised vancomycin and to add Primaxin for better  coverage.   I did discuss with the ophthalmologist at [facility], and they had  agreed to accept the patient.  Patient was accepted by [facility]  under Dr. Aquilla Hacker.  Patient also agreed with transfer.  Patient will  be transferred.   Procedures done during this stay were only a CAT scan of the orbit,  which showed pre septal and post-septal orbital cellulitis.  No  drainable fluid seen.   PERTINENT LABS:  WBC was 20.   FINAL DIAGNOSES:  1. Orbital cellulitis, pre and post-orbital, right eye.  2. Bronchial asthma, stable at this time.   PLAN:  Patient has been transferred to [facility] for further  management.  Patient was agreeable to the plan.      Eduard Clos, MD  Electronically Signed     ANK/MEDQ  D:  06/14/2009  T:  06/14/2009  Job:  [id number]

## 2011-07-05 LAB — URINE MICROSCOPIC-ADD ON

## 2011-07-05 LAB — URINALYSIS, ROUTINE W REFLEX MICROSCOPIC
Glucose, UA: NEGATIVE
Ketones, ur: 15 — AB
Leukocytes, UA: NEGATIVE
pH: 6

## 2011-07-05 LAB — RAPID URINE DRUG SCREEN, HOSP PERFORMED: Barbiturates: NOT DETECTED

## 2011-07-09 LAB — CBC
Hemoglobin: 15.5
MCHC: 33.6
MCV: 88.8
RBC: 5.19

## 2011-07-09 LAB — CULTURE, BLOOD (ROUTINE X 2)
Culture: NO GROWTH
Culture: NO GROWTH

## 2011-07-09 LAB — BASIC METABOLIC PANEL
CO2: 24
Calcium: 9.6
Chloride: 103
Creatinine, Ser: 1.05
GFR calc Af Amer: >60
GFR calc non Af Amer: >60
Glucose, Bld: 179 — ABNORMAL HIGH
Sodium: 137
Sodium: 137

## 2011-07-09 LAB — DIFFERENTIAL
Basophils Relative: 0
Eosinophils Absolute: 0
Monocytes Absolute: 0.1
Monocytes Relative: 1 — ABNORMAL LOW

## 2011-08-08 ENCOUNTER — Emergency Department (HOSPITAL_COMMUNITY)
Admission: EM | Admit: 2011-08-08 | Discharge: 2011-08-08 | Disposition: A | Discharge status: Home / Self Care | Payer: Self-pay | Attending: Emergency Medicine | Admitting: Emergency Medicine

## 2011-08-08 DIAGNOSIS — R112 Nausea with vomiting, unspecified: Secondary | ICD-10-CM | POA: Insufficient documentation

## 2011-08-08 DIAGNOSIS — R059 Cough, unspecified: Secondary | ICD-10-CM | POA: Insufficient documentation

## 2011-08-08 DIAGNOSIS — B9789 Other viral agents as the cause of diseases classified elsewhere: Secondary | ICD-10-CM | POA: Insufficient documentation

## 2011-08-08 DIAGNOSIS — R6883 Chills (without fever): Secondary | ICD-10-CM | POA: Insufficient documentation

## 2011-08-08 DIAGNOSIS — J45909 Unspecified asthma, uncomplicated: Secondary | ICD-10-CM | POA: Insufficient documentation

## 2011-08-08 DIAGNOSIS — R05 Cough: Secondary | ICD-10-CM | POA: Insufficient documentation

## 2011-08-08 DIAGNOSIS — IMO0001 Reserved for inherently not codable concepts without codable children: Secondary | ICD-10-CM | POA: Insufficient documentation

## 2011-08-08 DIAGNOSIS — J3489 Other specified disorders of nose and nasal sinuses: Secondary | ICD-10-CM | POA: Insufficient documentation

## 2012-10-02 ENCOUNTER — Emergency Department (HOSPITAL_COMMUNITY)
Admission: EM | Admit: 2012-10-02 | Discharge: 2012-10-02 | Attending: Emergency Medicine | Admitting: Emergency Medicine

## 2012-10-02 ENCOUNTER — Encounter (HOSPITAL_COMMUNITY): Payer: Self-pay

## 2012-10-02 ENCOUNTER — Emergency Department (HOSPITAL_COMMUNITY)

## 2012-10-02 DIAGNOSIS — J45901 Unspecified asthma with (acute) exacerbation: Secondary | ICD-10-CM | POA: Insufficient documentation

## 2012-10-02 DIAGNOSIS — Z79899 Other long term (current) drug therapy: Secondary | ICD-10-CM | POA: Insufficient documentation

## 2012-10-02 DIAGNOSIS — J45909 Unspecified asthma, uncomplicated: Secondary | ICD-10-CM

## 2012-10-02 HISTORY — DX: Unspecified asthma, uncomplicated: J45.909

## 2012-10-02 MED ORDER — PREDNISONE 10 MG PO TABS: 20.0000 mg | ORAL_TABLET | Freq: Every day | ORAL | Status: DC

## 2012-10-02 MED ORDER — ALBUTEROL SULFATE (5 MG/ML) 0.5% IN NEBU
5.0000 mg | INHALATION_SOLUTION | Freq: Once | RESPIRATORY_TRACT | Status: AC
  Administered 2012-10-02: 5 mg via RESPIRATORY_TRACT
  Filled 2012-10-02: qty 1

## 2012-10-02 MED ORDER — PREDNISONE 50 MG PO TABS
60.0000 mg | ORAL_TABLET | Freq: Once | ORAL | Status: AC
  Administered 2012-10-02: 60 mg via ORAL
  Filled 2012-10-02: qty 1

## 2012-10-02 NOTE — ED Provider Notes (Signed)
History     CSN: [id number]  Arrival date & time 10/02/12  0440   First MD Initiated Contact with Patient 10/02/12 0441      Chief Complaint  Patient presents with  . Asthma    (Consider location/radiation/quality/duration/timing/severity/associated sxs/prior treatment) HPI Ian Terry is a 26 y.o. male  With a h/o asthma brought in by ambulance, who presents to the Emergency Department complaining of wheezing and shortness of breath that began tonight. He was given two treatments en route. Denies fever, chills, nausea, vomiting, cough. Past Medical History  Diagnosis Date  . Asthma     Past Surgical History  Procedure Date  . Eye surgery     No family history on file.  History  Substance Use Topics  . Smoking status: Never Smoker   . Smokeless tobacco: Not on file  . Alcohol Use: No      Review of Systems  Constitutional: Negative for fever.       10 Systems reviewed and are negative for acute change except as noted in the HPI.  HENT: Negative for congestion.   Eyes: Negative for discharge and redness.  Respiratory: Positive for shortness of breath and wheezing. Negative for cough.   Cardiovascular: Negative for chest pain.  Gastrointestinal: Negative for vomiting and abdominal pain.  Musculoskeletal: Negative for back pain.  Skin: Negative for rash.  Neurological: Negative for syncope, numbness and headaches.  Psychiatric/Behavioral:       No behavior change.    Allergies  Review of patient's allergies indicates no known allergies.  Home Medications   Current Outpatient Rx  Name  Route  Sig  Dispense  Refill  . BECLOMETHASONE DIPROPIONATE 40 MCG/ACT IN AERS   Inhalation   Inhale 2 puffs into the lungs 2 (two) times daily.         . IPRATROPIUM BROMIDE HFA 17 MCG/ACT IN AERS   Inhalation   Inhale 2 puffs into the lungs every 6 (six) hours as needed.         Marland Kitchen LORATADINE 10 MG PO TABS   Oral   Take 10 mg by mouth daily.         Marland Kitchen  MONTELUKAST SODIUM 10 MG PO TABS   Oral   Take 10 mg by mouth at bedtime.           BP 139/87  Pulse 75  Temp 98 F (36.7 C) (Oral)  Resp 20  Ht 5\' 7"  (1.702 m)  Wt 160 lb (72.576 kg)  BMI 25.06 kg/m2  SpO2 100%  Physical Exam  Nursing note and vitals reviewed. Constitutional: He appears well-developed.       Awake, alert, nontoxic appearance.  HENT:  Head: Normocephalic and atraumatic.  Eyes: Right eye exhibits no discharge. Left eye exhibits no discharge.  Neck: Neck supple.  Cardiovascular: Normal heart sounds.   Pulmonary/Chest: Effort normal. He has wheezes. He exhibits no tenderness.       He is able to speak in full sentences. Wheezing with expiration. No accessory muscle use. Fair air movement.  Abdominal: Soft. There is no tenderness. There is no rebound.  Musculoskeletal: He exhibits no tenderness.       Baseline ROM, no obvious new focal weakness.  Neurological:       Mental status and motor strength appears baseline for patient and situation.  Skin: No rash noted.  Psychiatric: He has a normal mood and affect.    ED Course  Procedures (including critical care time)  Dg Chest 2 View  10/02/2012  *RADIOLOGY REPORT*  Clinical Data: Asthma  CHEST - 2 VIEW  Comparison: 11/17/2009  Findings: Lungs are clear. No pleural effusion or pneumothorax. The cardiomediastinal contours are within normal limits. The visualized bones and soft tissues are without significant appreciable abnormality.  IMPRESSION: No radiographic evidence of acute cardiopulmonary process.   Original Report Authenticated By: Jearld Lesch, M.D.      MDM  Patient with h/o asthma here with shortness of breath and wheezing. Initiated steroid therapy. He had received 2 nebulizer treatments en route to the hospital. Wheezing improved. O2 sats remain 98% on RA. Given an additional albuterol treatment prior to discharge. Pt stable in ED with no significant deterioration in condition.The patient  appears reasonably screened and/or stabilized for discharge and I doubt any other medical condition or other [facility] requiring further screening, evaluation, or treatment in the ED at this time prior to discharge.  MDM Reviewed: nursing note and vitals Interpretation: x-ray           Nicoletta Dress. Colon Branch, MD 10/02/12 [phone]
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
## 2012-10-02 NOTE — ED Notes (Signed)
Has been using inhalers without relief tonight. Was given 2 duo nebs en route to the ER.
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
## 2012-10-02 NOTE — ED Notes (Signed)
Respiratory notified that patient needs breathing treatment. Will be here ASAP to administer it.

## 2015-08-10 ENCOUNTER — Emergency Department (HOSPITAL_COMMUNITY): Payer: No Typology Code available for payment source

## 2015-08-10 ENCOUNTER — Encounter (HOSPITAL_COMMUNITY): Payer: Self-pay

## 2015-08-10 ENCOUNTER — Emergency Department (HOSPITAL_COMMUNITY)
Admission: EM | Admit: 2015-08-10 | Discharge: 2015-08-11 | Disposition: A | Discharge status: Home / Self Care | Payer: No Typology Code available for payment source | Attending: Physician Assistant | Admitting: Physician Assistant

## 2015-08-10 DIAGNOSIS — S6992XA Unspecified injury of left wrist, hand and finger(s), initial encounter: Secondary | ICD-10-CM | POA: Diagnosis not present

## 2015-08-10 DIAGNOSIS — R042 Hemoptysis: Secondary | ICD-10-CM | POA: Diagnosis not present

## 2015-08-10 DIAGNOSIS — S199XXA Unspecified injury of neck, initial encounter: Secondary | ICD-10-CM | POA: Insufficient documentation

## 2015-08-10 DIAGNOSIS — Y9389 Activity, other specified: Secondary | ICD-10-CM | POA: Diagnosis not present

## 2015-08-10 DIAGNOSIS — Z7951 Long term (current) use of inhaled steroids: Secondary | ICD-10-CM | POA: Diagnosis not present

## 2015-08-10 DIAGNOSIS — J45909 Unspecified asthma, uncomplicated: Secondary | ICD-10-CM | POA: Insufficient documentation

## 2015-08-10 DIAGNOSIS — K921 Melena: Secondary | ICD-10-CM | POA: Insufficient documentation

## 2015-08-10 DIAGNOSIS — Y92410 Unspecified street and highway as the place of occurrence of the external cause: Secondary | ICD-10-CM | POA: Insufficient documentation

## 2015-08-10 DIAGNOSIS — Y998 Other external cause status: Secondary | ICD-10-CM | POA: Diagnosis not present

## 2015-08-10 LAB — COMPREHENSIVE METABOLIC PANEL
ALT: 17 U/L (ref 17–63)
AST: 34 U/L (ref 15–41)
Albumin: 4.1 g/dL (ref 3.5–5.0)
Alkaline Phosphatase: 63 U/L (ref 38–126)
Anion gap: 5 (ref 5–15)
BILIRUBIN TOTAL: 0.6 mg/dL (ref 0.3–1.2)
BUN: 21 mg/dL — AB (ref 6–20)
CHLORIDE: 108 mmol/L (ref 101–111)
CO2: 27 mmol/L (ref 22–32)
CREATININE: 1.2 mg/dL (ref 0.61–1.24)
Calcium: 8.9 mg/dL (ref 8.9–10.3)
GFR calc Af Amer: >60 mL/min (ref >60)
Glucose, Bld: 100 mg/dL — ABNORMAL HIGH (ref 65–99)
Potassium: 4.1 mmol/L (ref 3.5–5.1)
Sodium: 140 mmol/L (ref 135–145)
Total Protein: 7.9 g/dL (ref 6.5–8.1)

## 2015-08-10 LAB — ABO/RH: ABO/RH(D): O POS

## 2015-08-10 LAB — CBC
HEMATOCRIT: 43.1 % (ref 39.0–52.0)
Hemoglobin: 14.3 g/dL (ref 13.0–17.0)
MCH: 29.9 pg (ref 26.0–34.0)
MCHC: 33.2 g/dL (ref 30.0–36.0)
MCV: 90.2 fL (ref 78.0–100.0)
Platelets: 312 10*3/uL (ref 150–400)
RBC: 4.78 MIL/uL (ref 4.22–5.81)
RDW: 12 % (ref 11.5–15.5)
WBC: 8.3 10*3/uL (ref 4.0–10.5)

## 2015-08-10 LAB — TYPE AND SCREEN
ABO/RH(D): O POS
ANTIBODY SCREEN: NEGATIVE

## 2015-08-10 LAB — TROPONIN I: Troponin I: <0.03 ng/mL (ref <0.031)

## 2015-08-10 MED ORDER — MORPHINE SULFATE (PF) 4 MG/ML IV SOLN
4.0000 mg | Freq: Once | INTRAVENOUS | Status: AC
  Administered 2015-08-11: 4 mg via INTRAVENOUS
  Filled 2015-08-10: qty 1

## 2015-08-10 MED ORDER — IOHEXOL 300 MG/ML  SOLN
100.0000 mL | Freq: Once | INTRAMUSCULAR | Status: AC | PRN
  Administered 2015-08-10: 100 mL via INTRAVENOUS

## 2015-08-10 NOTE — ED Notes (Signed)
Pt went to the bathroom in triage and it was a small amount of bright red blood with no visible stool
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
## 2015-08-11 MED ORDER — IBUPROFEN 800 MG PO TABS: 800.0000 mg | ORAL_TABLET | Freq: Three times a day (TID) | ORAL | Status: DC | PRN

## 2015-08-11 MED ORDER — METHOCARBAMOL 500 MG PO TABS: 500.0000 mg | ORAL_TABLET | Freq: Two times a day (BID) | ORAL | Status: DC
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
## 2015-12-05 ENCOUNTER — Encounter (HOSPITAL_COMMUNITY): Payer: Self-pay | Admitting: Emergency Medicine

## 2015-12-05 ENCOUNTER — Emergency Department (HOSPITAL_COMMUNITY)
Admission: EM | Admit: 2015-12-05 | Discharge: 2015-12-05 | Disposition: A | Discharge status: Home / Self Care | Attending: Emergency Medicine | Admitting: Emergency Medicine

## 2015-12-05 ENCOUNTER — Emergency Department (HOSPITAL_COMMUNITY)

## 2015-12-05 DIAGNOSIS — J4520 Mild intermittent asthma, uncomplicated: Secondary | ICD-10-CM

## 2015-12-05 DIAGNOSIS — Z7951 Long term (current) use of inhaled steroids: Secondary | ICD-10-CM | POA: Insufficient documentation

## 2015-12-05 DIAGNOSIS — J4521 Mild intermittent asthma with (acute) exacerbation: Secondary | ICD-10-CM | POA: Insufficient documentation

## 2015-12-05 DIAGNOSIS — Z79899 Other long term (current) drug therapy: Secondary | ICD-10-CM | POA: Insufficient documentation

## 2015-12-05 MED ORDER — ALBUTEROL SULFATE (2.5 MG/3ML) 0.083% IN NEBU
5.0000 mg | INHALATION_SOLUTION | Freq: Once | RESPIRATORY_TRACT | Status: AC
  Administered 2015-12-05: 5 mg via RESPIRATORY_TRACT
  Filled 2015-12-05: qty 6

## 2015-12-05 MED ORDER — ALBUTEROL SULFATE HFA 108 (90 BASE) MCG/ACT IN AERS
2.0000 | INHALATION_SPRAY | RESPIRATORY_TRACT | Status: DC | PRN
  Administered 2015-12-05: 2 via RESPIRATORY_TRACT
  Filled 2015-12-05: qty 6.7

## 2015-12-05 MED ORDER — PREDNISONE 20 MG PO TABS
60.0000 mg | ORAL_TABLET | Freq: Once | ORAL | Status: AC
  Administered 2015-12-05: 60 mg via ORAL
  Filled 2015-12-05: qty 3

## 2015-12-05 MED ORDER — IPRATROPIUM BROMIDE 0.02 % IN SOLN
0.5000 mg | Freq: Once | RESPIRATORY_TRACT | Status: AC
  Administered 2015-12-05: 0.5 mg via RESPIRATORY_TRACT
  Filled 2015-12-05: qty 2.5

## 2015-12-05 MED ORDER — PREDNISONE 20 MG PO TABS: ORAL_TABLET | ORAL | Status: DC

## 2015-12-05 NOTE — ED Provider Notes (Signed)
CSN: [id number]     Arrival date & time 12/05/15  1428 History  By signing my name below, I, Phillis Haggis, attest that this documentation has been prepared under the direction and in the presence of Sharilyn Sites, PA-C Electronically Signed: Phillis Haggis, ED Scribe. 12/05/2015. 5:24 PM.   Chief Complaint  Patient presents with  . Asthma   The history is provided by the patient. No language interpreter was used.  HPI Comments: DELTA DESHMUKH is a 30 y.o. male with a hx of asthma brought in by EMS who presents to the Emergency Department complaining of chest tightness onset one day ago. He reports associated wheezing, non-productive cough and SOB. Pt states that this is a flare-up of his asthma. He usually takes an albuterol inhaler but has since run out. Pt had a breathing treatment, albuterol , en route to no relief. He does not have a PCP to get a medication refill. He denies fever or chills.  No chest pain.  VSS.  Past Medical History  Diagnosis Date  . Asthma    Past Surgical History  Procedure Laterality Date  . Eye surgery     No family history on file. Social History  Substance Use Topics  . Smoking status: Never Smoker   . Smokeless tobacco: None  . Alcohol Use: No    Review of Systems  Constitutional: Negative for fever and chills.  Respiratory: Positive for cough, chest tightness, shortness of breath and wheezing.   All other systems reviewed and are negative.  Allergies  Review of patient's allergies indicates no known allergies.  Home Medications   Prior to Admission medications   Medication Sig Start Date End Date Taking? Authorizing Provider  budesonide-formoterol (SYMBICORT) 160-4.5 MCG/ACT inhaler Inhale 2 puffs into the lungs 2 (two) times daily.    Historical Provider, MD  ibuprofen (ADVIL,MOTRIN) 800 MG tablet Take 1 tablet (800 mg total) by mouth every 8 (eight) hours as needed for moderate pain. 08/11/15   Fayrene Helper, PA-C  methocarbamol (ROBAXIN) 500  MG tablet Take 1 tablet (500 mg total) by mouth 2 (two) times daily. 08/11/15   Fayrene Helper, PA-C  predniSONE (DELTASONE) 10 MG tablet Take 2 tablets (20 mg total) by mouth daily. Patient not taking: Reported on 08/10/2015 10/02/12   Annamarie Dawley, MD   BP 121/55 mmHg  Pulse 81  Temp(Src) 98.4 F (36.9 C) (Oral)  Resp 18  SpO2 100%   Physical Exam  Constitutional: He is oriented to person, place, and time. He appears well-developed and well-nourished.  HENT:  Head: Normocephalic and atraumatic.  Right Ear: Hearing, tympanic membrane and ear canal normal.  Left Ear: Hearing, tympanic membrane and ear canal normal.  Nose: Nose normal.  Mouth/Throat: Uvula is midline, oropharynx is clear and moist and mucous membranes are normal. No oropharyngeal exudate, posterior oropharyngeal edema, posterior oropharyngeal erythema or tonsillar abscesses.  Eyes: Conjunctivae and EOM are normal. Pupils are equal, round, and reactive to light.  Neck: Normal range of motion.  Cardiovascular: Normal rate, regular rhythm and normal heart sounds.   Pulmonary/Chest: Effort normal. He has wheezes.  Expiratory wheezes throughout, no rhonchi or rales; no distress, speaking in full sentences without difficulty, O2 sats 100%  Abdominal: Soft. Bowel sounds are normal.  Musculoskeletal: Normal range of motion.  Neurological: He is alert and oriented to person, place, and time.  Skin: Skin is warm and dry.  Psychiatric: He has a normal mood and affect.  Nursing note and vitals reviewed.  ED Course  Procedures (including critical care time) DIAGNOSTIC STUDIES: Oxygen Saturation is 98% on RA, normal by my interpretation.    COORDINATION OF CARE: 5:23 PM-Discussed treatment plan which includes x-ray and breathing treatment with pt at bedside and pt agreed to plan.    Labs Review Labs Reviewed - No data to display  Imaging Review Dg Chest 2 View  12/05/2015  CLINICAL DATA:  Wheezing today, history asthma,  mid chest pain and cough for 2 days, former smoker EXAM: CHEST  2 VIEW COMPARISON:  08/10/2015 FINDINGS: Normal heart size, mediastinal contours, and pulmonary vascularity. Minimal chronic peribronchial thickening. Lungs otherwise clear. No infiltrate, pleural effusion or pneumothorax. Bones unremarkable. IMPRESSION: Minimal chronic bronchitic changes without infiltrate. Electronically Signed   By: Ulyses Southward M.D.   On: 12/05/2015 16:21   I have personally reviewed and evaluated these images and lab results as part of my medical decision-making.   EKG Interpretation None      MDM   Final diagnoses:  Asthma, mild intermittent, uncomplicated   29 year old male here with shortness of breath and wheezing. He reports trouble with his asthma over the past few days. Patient is afebrile, nontoxic. He is in no acute respiratory distress. He does have expiratory wheezes throughout. He received an albuterol treatment prior to arrival by EMS. He was given an additional treatment here in the ED as well as a dose of prednisone with resolution of symptoms. He states he is feeling well and is ready to go home. He was given an albuterol inhaler to take home as well as a prednisone taper. Recommended to establish care with a PCP in the area, resource guide provided.  Discussed plan with patient, he/she acknowledged understanding and agreed with plan of care.  Return precautions given for new or worsening symptoms.  I personally performed the services described in this documentation, which was scribed in my presence. The recorded information has been reviewed and is accurate.  Garlon Hatchet, PA-C 12/05/15 1923  Tilden Fossa, MD 12/08/15 [phone]

## 2015-12-05 NOTE — Discharge Instructions (Signed)
Take the prescribed medication as directed.  Use inhaler as needed. Follow-up with a primary care physician in the area. See resource guide to help you with this. Return to the ED for new or worsening symptoms.   Emergency Department Resource Guide 1) Find a Doctor and Pay Out of Pocket Although you won't have to find out who is covered by your insurance plan, it is a good idea to ask around and get recommendations. You will then need to call the office and see if the doctor you have chosen will accept you as a new patient and what types of options they offer for patients who are self-pay. Some doctors offer discounts or will set up payment plans for their patients who do not have insurance, but you will need to ask so you aren't surprised when you get to your appointment.  2) Contact Your Local Health Department Not all health departments have doctors that can see patients for sick visits, but many do, so it is worth a call to see if yours does. If you don't know where your local health department is, you can check in your phone book. The CDC also has a tool to help you locate your state's health department, and many state websites also have listings of all of their local health departments.  3) Find a Walk-in Clinic If your illness is not likely to be very severe or complicated, you may want to try a walk in clinic. These are popping up all over the country in pharmacies, drugstores, and shopping centers. They're usually staffed by nurse practitioners or physician assistants that have been trained to treat common illnesses and complaints. They're usually fairly quick and inexpensive. However, if you have serious medical issues or chronic medical problems, these are probably not your best option.  No Primary Care Doctor: - Call Health Connect at  [phone] - they can help you locate a primary care doctor that  accepts your insurance, provides certain services, etc. - Physician Referral Service-  [phone]  Chronic Pain Problems: Organization         Address  Phone   Notes  [facility]  [phone] Patients need to be referred by their primary care doctor.   Medication Assistance: Organization         Address  Phone   Notes  [facility] Medication [facility] [address] [place], [place] [address] [phone] --Must be a resident of [facility] -- Must have NO insurance coverage whatsoever (no Medicaid/ Medicare, etc.) -- The pt. MUST have a primary care doctor that directs their care regularly and follows them in the community   [facility]  [phone]   [employer]  [phone]    Agencies that provide inexpensive medical care: Organization         Address  Phone   Notes  [facility]  [phone]   [facility]    [phone]   [facility] [address] [place], [place] [address] [phone]   [facility] [address], [place] [phone]   Planned Parenthood    [phone]   [facility]    [phone]   [facility] and [facility]  [address], [place] Phone:  [phone], Fax:  [phone] Hours of Operation:  9 am - 6 pm, M-F.  Also accepts Medicaid/Medicare and self-pay.  [facility]  [place] for [place], [address], [place] Phone: [phone], Fax: [phone]. Hours of Operation:  8:30 am - 5:30 pm, M-F.  Also accepts Medicaid and self-pay.  [facility] [address] [place] Phone: [phone]   [place], [place], [place] [phone], Ext. [phone] Mondays & Thursdays: 7-9 AM.  First 15 patients are seen on a first come, first serve basis.    [place] Providers:  Organization         Address  Phone   Notes  [facility] [address],  [place] [phone] Also accepts self-pay patients.  [facility] [id number] [place], [place]  [phone]   [place], [address], [place] [phone]   [facility] [address] [place] [phone]   Lucianne Lei [address], [place]   [phone] Only accepts [place] Access [place] patients after they have their name applied to their card.   Self-Pay (no insurance) in [facility]:  Organization         Address  Phone   Notes  Sickle Cell Patients, [facility] Internal Medicine [place] [phone]   [facility] [place] [phone]   [facility] [place]  [place], [place], [place] [phone]   [facility]/Dr. Osei-Bonsu  [address], [place] or [address], [place] [phone] Phone number for both [place] and [place] locations is the same.  [facility] [address] [place] [phone]   [facility]  [address], [place] or [address] [place] [address] [phone]   [facility] [address], [place] [phone], phone; [phone], fax Sees patients 1st and 3rd Saturday of every month.  Must not qualify for public or private insurance (i.e. Medicaid, Medicare, [place] Health Choice, Veterans' Benefits)  Household income should be no more than 200% of the poverty level The clinic cannot treat you if you are pregnant or think you are pregnant  Sexually transmitted diseases are not treated at the clinic.    Dental Care: Organization         Address  Phone  Notes  [facility] Department of [facility] [place] [phone] Accepts children up to age 40 who are enrolled in  [place] or [place]; pregnant women with a Medicaid card; and children who have applied for Medicaid or [place] Health Choice, but were declined, whose parents can pay a reduced fee at time of service.  [facility] Department of [facility]  [address], [place] [phone] Accepts children up to age 85 who are enrolled in [place] or [place]; pregnant women with a Medicaid card; and children who have applied for Medicaid or [place] Health Choice, but were declined, whose parents can pay a reduced fee at time of service.  [place] [facility] Access PRO[facility]  [place] [phone] Patients are seen by appointment only. Walk-ins are not accepted. [place] will see patients 62 years of age and older. Monday - Tuesday (8am-5pm) Most Wednesdays (8:30-5pm) $30 per visit,  cash only  [employer] Adult [employer] PROGRAM  [address], [place] [phone] Patients are seen by appointment only. Walk-ins are not accepted. [place] will see patients 68 years of age and older. One Wednesday Evening (Monthly: Volunteer Based).  $30 per visit, cash only  [place]  [phone] for adults; Children under age 32, call Graduate Pediatric Dentistry at [phone]. Children aged 36-14, please call [phone] to request a pediatric application.  Dental services are provided in all areas of dental care including fillings, crowns and bridges, complete and partial dentures, implants, gum treatment, root canals, and extractions. Preventive care is also provided. Treatment is provided to both adults and children. Patients are selected via a lottery and there is often a waiting list.   [facility] [address] [place]  [phone] [email or web address]   [facility] [address] [place], [place] [phone], Ext. [phone] Second and Fourth Thursday of each month, opens at 6:30  AM; Clinic ends at 9 AM.  Patients are seen on a first-come first-served basis, and a limited number are seen during each clinic.   [facility]  [address] [place]ard Danker [place], [place] [phone]   Eligibility Requirements You must have lived in [place], [place], or [place] counties for at least the last three months.   You cannot be eligible for state or federal sponsored [employer], including [employer], [place], or [employer].   You generally cannot be eligible for healthcare insurance through your employer.    How to apply: Eligibility screenings are held every Tuesday and Wednesday afternoon from 1:00 pm until 4:00 pm. You do not need an appointment for the interview!  [facility] [address] [place], [place]   [place]  [place] Department  [place]  [phone]    Behavioral Health Resources in the Community: Intensive Outpatient Programs Organization         Address  Phone  Notes  Bejou Flanagan. [address], [place], [place] [phone]   [facility] Outpatient [address] [place], [place]   ADS: [facility] [address] [place], [place]   [place] [address],  [place], [place] or [phone]   Substance Abuse Resources Organization         Address  Phone  Notes  Alcohol and Drug Services  [phone]   [place]  [phone]   [facility] [place]   Chinita Pester  [phone]   Residential & Outpatient Substance Abuse Program  [phone]   Psychological Services Organization         Address  Phone  Notes  [facility] [place]  [place]  [phone]   [place] [address] [place] or  [phone]    Mobile Crisis Teams Organization         Address  Phone  Notes  [facility], Mobile Crisis Care Unit  [phone]   [facility]  [address] [place], [place]   [place] [address], [place] [phone]    Self-Help/Support Groups Organization         Address  Phone  Notes  [facility] [place] - variety of support groups  [place] Call for more information  Narcotics Anonymous (NA), [facility] [address], [employer] [place]  2 meetings at this location   Special educational needs teacher         Address  Phone  Notes  [facility] Residential Treatment [place],    [place]  [phone]   [facility]  [address] [place] [phone], [place], [place]   [place], [place] [phone] Admissions: 8am-3pm M-F  Incentives Substance [place] [address]    [place], [place] [phone]   [facility] [address] [place], [place], [place]   [facility] [address],  [place], [place]   [facility] - Intensive Outpatient [address], [place], [place]   [facility] [address]  [place], [place] [phone] or [phone]   [facility] (RTS) [address] [place], [place] Accepts Medicaid  [facility] [place] [address],  [place] [phone] Substance Abuse/Addiction Treatment   [facility] Organization         Address  Phone  Notes  [facility]  [phone]   Domenic Schwab, PhD [address] Arlis Porta [place], [place]   [phone] or [phone]   [place] and [place], [place] [phone]   [facility] [address],  [place], [place] [phone] Insurance/Medicaid/sponsorship through [facility] and Families [address], [address] [place]                                    [place], [place] [phone] [place] [address] [place], [place] [phone]    Dr. Adele Schilder  [phone]   [facility] Dept. 1) [address], [place] 2) [place] 3)  [place] [address], [place] [phone]  [phone]   [place] [phone] or [phone] (After Hours)

## 2015-12-05 NOTE — ED Notes (Signed)
Per EMS, states history of asthma-increased symptoms-albuterol 5 mg given in route-was not wheezing when EMS arrived-no respiratory distress

## 2016-07-08 ENCOUNTER — Emergency Department (HOSPITAL_COMMUNITY): Payer: Self-pay

## 2016-07-08 ENCOUNTER — Encounter (HOSPITAL_COMMUNITY): Payer: Self-pay | Admitting: Emergency Medicine

## 2016-07-08 DIAGNOSIS — J45901 Unspecified asthma with (acute) exacerbation: Secondary | ICD-10-CM | POA: Diagnosis present

## 2016-07-08 DIAGNOSIS — Z716 Tobacco abuse counseling: Secondary | ICD-10-CM

## 2016-07-08 DIAGNOSIS — R739 Hyperglycemia, unspecified: Secondary | ICD-10-CM | POA: Diagnosis present

## 2016-07-08 DIAGNOSIS — F17200 Nicotine dependence, unspecified, uncomplicated: Secondary | ICD-10-CM | POA: Diagnosis present

## 2016-07-08 DIAGNOSIS — T380X5A Adverse effect of glucocorticoids and synthetic analogues, initial encounter: Secondary | ICD-10-CM | POA: Diagnosis present

## 2016-07-08 DIAGNOSIS — A400 Sepsis due to streptococcus, group A: Principal | ICD-10-CM | POA: Diagnosis present

## 2016-07-08 DIAGNOSIS — E872 Acidosis: Secondary | ICD-10-CM | POA: Diagnosis present

## 2016-07-08 DIAGNOSIS — Z9889 Other specified postprocedural states: Secondary | ICD-10-CM

## 2016-07-08 DIAGNOSIS — H5441 Blindness, right eye, normal vision left eye: Secondary | ICD-10-CM | POA: Diagnosis present

## 2016-07-08 DIAGNOSIS — J020 Streptococcal pharyngitis: Secondary | ICD-10-CM | POA: Diagnosis present

## 2016-07-08 DIAGNOSIS — R000 Tachycardia, unspecified: Secondary | ICD-10-CM | POA: Diagnosis present

## 2016-07-08 LAB — CBC WITH DIFFERENTIAL/PLATELET
Basophils Absolute: 0 10*3/uL (ref 0.0–0.1)
Basophils Relative: 0 %
EOS ABS: 0.5 10*3/uL (ref 0.0–0.7)
EOS PCT: 6 %
HCT: 43.8 % (ref 39.0–52.0)
Hemoglobin: 14.5 g/dL (ref 13.0–17.0)
LYMPHS PCT: 16 %
Lymphs Abs: 1.2 10*3/uL (ref 0.7–4.0)
MCH: 29.9 pg (ref 26.0–34.0)
MCHC: 33.1 g/dL (ref 30.0–36.0)
MCV: 90.3 fL (ref 78.0–100.0)
MONO ABS: 1 10*3/uL (ref 0.1–1.0)
Monocytes Relative: 13 %
Neutro Abs: 5 10*3/uL (ref 1.7–7.7)
Neutrophils Relative %: 65 %
PLATELETS: 238 10*3/uL (ref 150–400)
RBC: 4.85 MIL/uL (ref 4.22–5.81)
RDW: 12 % (ref 11.5–15.5)
WBC: 7.7 10*3/uL (ref 4.0–10.5)

## 2016-07-08 LAB — BASIC METABOLIC PANEL
ANION GAP: 7 (ref 5–15)
BUN: 16 mg/dL (ref 6–20)
CALCIUM: 8.6 mg/dL — AB (ref 8.9–10.3)
CO2: 23 mmol/L (ref 22–32)
Chloride: 107 mmol/L (ref 101–111)
Creatinine, Ser: 1.28 mg/dL — ABNORMAL HIGH (ref 0.61–1.24)
GFR calc Af Amer: >60 mL/min (ref >60)
GLUCOSE: 104 mg/dL — AB (ref 65–99)
Potassium: 3.7 mmol/L (ref 3.5–5.1)
SODIUM: 137 mmol/L (ref 135–145)

## 2016-07-08 MED ORDER — ALBUTEROL SULFATE (2.5 MG/3ML) 0.083% IN NEBU
5.0000 mg | INHALATION_SOLUTION | Freq: Once | RESPIRATORY_TRACT | Status: AC
  Administered 2016-07-08: 5 mg via RESPIRATORY_TRACT

## 2016-07-08 MED ORDER — ALBUTEROL SULFATE (2.5 MG/3ML) 0.083% IN NEBU
INHALATION_SOLUTION | RESPIRATORY_TRACT | Status: AC
  Filled 2016-07-08: qty 6

## 2016-07-08 MED ORDER — ACETAMINOPHEN 325 MG PO TABS
650.0000 mg | ORAL_TABLET | Freq: Once | ORAL | Status: AC
  Administered 2016-07-08: 650 mg via ORAL

## 2016-07-08 MED ORDER — ACETAMINOPHEN 325 MG PO TABS
ORAL_TABLET | ORAL | Status: AC
  Filled 2016-07-08: qty 2

## 2016-07-08 NOTE — ED Triage Notes (Signed)
Pt. reports asthma with wheezing , dry cough ,nasal congestion , chest congestion and fever/chills onset today .

## 2016-07-09 ENCOUNTER — Inpatient Hospital Stay (HOSPITAL_COMMUNITY)
Admission: EM | Admit: 2016-07-09 | Discharge: 2016-07-11 | DRG: 872 | Disposition: A | Discharge status: Home / Self Care | Payer: Self-pay | Attending: Internal Medicine | Admitting: Internal Medicine

## 2016-07-09 ENCOUNTER — Encounter (HOSPITAL_COMMUNITY): Payer: Self-pay | Admitting: General Practice

## 2016-07-09 DIAGNOSIS — F17200 Nicotine dependence, unspecified, uncomplicated: Secondary | ICD-10-CM

## 2016-07-09 DIAGNOSIS — J45901 Unspecified asthma with (acute) exacerbation: Secondary | ICD-10-CM

## 2016-07-09 DIAGNOSIS — R059 Cough, unspecified: Secondary | ICD-10-CM

## 2016-07-09 DIAGNOSIS — R509 Fever, unspecified: Secondary | ICD-10-CM

## 2016-07-09 DIAGNOSIS — R0989 Other specified symptoms and signs involving the circulatory and respiratory systems: Secondary | ICD-10-CM

## 2016-07-09 DIAGNOSIS — R062 Wheezing: Secondary | ICD-10-CM

## 2016-07-09 DIAGNOSIS — J020 Streptococcal pharyngitis: Secondary | ICD-10-CM | POA: Diagnosis present

## 2016-07-09 DIAGNOSIS — R05 Cough: Secondary | ICD-10-CM

## 2016-07-09 DIAGNOSIS — A419 Sepsis, unspecified organism: Secondary | ICD-10-CM

## 2016-07-09 HISTORY — DX: Legal blindness, as defined in USA: H54.8

## 2016-07-09 LAB — LACTIC ACID, PLASMA: Lactic Acid, Venous: 2.3 mmol/L (ref 0.5–1.9)

## 2016-07-09 LAB — RAPID STREP SCREEN (MED CTR MEBANE ONLY): STREPTOCOCCUS, GROUP A SCREEN (DIRECT): POSITIVE — AB

## 2016-07-09 MED ORDER — IPRATROPIUM-ALBUTEROL 0.5-2.5 (3) MG/3ML IN SOLN
3.0000 mL | Freq: Once | RESPIRATORY_TRACT | Status: AC
  Administered 2016-07-09: 3 mL via RESPIRATORY_TRACT
  Filled 2016-07-09: qty 3

## 2016-07-09 MED ORDER — GUAIFENESIN ER 600 MG PO TB12
600.0000 mg | ORAL_TABLET | Freq: Two times a day (BID) | ORAL | Status: DC
  Administered 2016-07-09 – 2016-07-11 (×5): 600 mg via ORAL
  Filled 2016-07-09 (×5): qty 1

## 2016-07-09 MED ORDER — ONDANSETRON HCL 4 MG PO TABS: 4.0000 mg | ORAL_TABLET | Freq: Four times a day (QID) | ORAL | Status: DC | PRN

## 2016-07-09 MED ORDER — ONDANSETRON HCL 4 MG/2ML IJ SOLN
4.0000 mg | Freq: Four times a day (QID) | INTRAMUSCULAR | Status: DC | PRN
Start: 2016-07-09 — End: 2016-07-11
  Administered 2016-07-09: 4 mg via INTRAVENOUS
  Filled 2016-07-09: qty 2

## 2016-07-09 MED ORDER — ACETAMINOPHEN 650 MG RE SUPP: 650.0000 mg | Freq: Four times a day (QID) | RECTAL | Status: DC | PRN

## 2016-07-09 MED ORDER — METHYLPREDNISOLONE SODIUM SUCC 125 MG IJ SOLR
60.0000 mg | Freq: Four times a day (QID) | INTRAMUSCULAR | Status: DC
  Administered 2016-07-09 – 2016-07-11 (×8): 60 mg via INTRAVENOUS
  Filled 2016-07-09 (×8): qty 2

## 2016-07-09 MED ORDER — IPRATROPIUM BROMIDE 0.02 % IN SOLN
0.5000 mg | RESPIRATORY_TRACT | Status: DC | PRN
  Administered 2016-07-09: 0.5 mg via RESPIRATORY_TRACT
  Filled 2016-07-09: qty 2.5

## 2016-07-09 MED ORDER — IBUPROFEN 800 MG PO TABS
800.0000 mg | ORAL_TABLET | Freq: Once | ORAL | Status: AC
  Administered 2016-07-09: 800 mg via ORAL
  Filled 2016-07-09: qty 1

## 2016-07-09 MED ORDER — SODIUM CHLORIDE 0.9 % IV BOLUS (SEPSIS): 1000.0000 mL | Freq: Once | INTRAVENOUS | Status: DC

## 2016-07-09 MED ORDER — PREDNISONE 20 MG PO TABS: 40.0000 mg | ORAL_TABLET | Freq: Every day | ORAL | Status: DC

## 2016-07-09 MED ORDER — ENOXAPARIN SODIUM 40 MG/0.4ML ~~LOC~~ SOLN
40.0000 mg | SUBCUTANEOUS | Status: DC
  Administered 2016-07-09 – 2016-07-11 (×3): 40 mg via SUBCUTANEOUS
  Filled 2016-07-09 (×4): qty 0.4

## 2016-07-09 MED ORDER — ALBUTEROL SULFATE (2.5 MG/3ML) 0.083% IN NEBU
2.5000 mg | INHALATION_SOLUTION | RESPIRATORY_TRACT | Status: DC | PRN
  Administered 2016-07-09 – 2016-07-10 (×6): 2.5 mg via RESPIRATORY_TRACT
  Filled 2016-07-09 (×6): qty 3

## 2016-07-09 MED ORDER — DEXTROSE 5 % IV SOLN
2.0000 g | INTRAVENOUS | Status: DC
  Administered 2016-07-09 – 2016-07-10 (×2): 2 g via INTRAVENOUS
  Filled 2016-07-09 (×3): qty 2

## 2016-07-09 MED ORDER — SODIUM CHLORIDE 0.9 % IV BOLUS (SEPSIS): 500.0000 mL | Freq: Once | INTRAVENOUS | Status: DC

## 2016-07-09 MED ORDER — SODIUM CHLORIDE 0.9 % IV SOLN
INTRAVENOUS | Status: DC
  Administered 2016-07-09 – 2016-07-11 (×2): via INTRAVENOUS

## 2016-07-09 MED ORDER — ACETAMINOPHEN 325 MG PO TABS: 650.0000 mg | ORAL_TABLET | Freq: Four times a day (QID) | ORAL | Status: DC | PRN

## 2016-07-09 MED ORDER — SODIUM CHLORIDE 0.9 % IV BOLUS (SEPSIS)
1000.0000 mL | Freq: Once | INTRAVENOUS | Status: DC
  Administered 2016-07-09: 1000 mL via INTRAVENOUS

## 2016-07-09 MED ORDER — PREDNISONE 20 MG PO TABS
60.0000 mg | ORAL_TABLET | Freq: Once | ORAL | Status: AC
  Administered 2016-07-09: 60 mg via ORAL
  Filled 2016-07-09: qty 3

## 2016-07-09 MED ORDER — PENICILLIN V POTASSIUM 250 MG/5ML PO SOLR
500.0000 mg | Freq: Two times a day (BID) | ORAL | Status: DC
  Filled 2016-07-09: qty 10

## 2016-07-09 NOTE — Progress Notes (Signed)
Patient admitted after midnight, please see H&P.  Strep throat-- will treat with IV rocephin as patient is vomiting.  Asthma exacerbation- IV steroids, nebs.  Marlin CanaryJessica Kimesha Claxton DO

## 2016-07-09 NOTE — ED Notes (Signed)
Pt ambulatory, received several breathing treatments with wheezing still noted in all fields. Pt respirations are even and unlabored.

## 2016-07-09 NOTE — Progress Notes (Signed)
Pt would like to take a shower, paged md asking if pt allowed.

## 2016-07-09 NOTE — Progress Notes (Signed)
rn was assessing pt, having pt take deep breaths to listen to lung sounds. This was finished and pt started coughing. Pt continued coughing and then vomited on the floor next to the bed. rn instructed the pt to go to the bath room. Pt continued to vomit into toilet. md made aware.

## 2016-07-09 NOTE — Progress Notes (Signed)
No further vomiting than the 1 incident this morning. Pt was about to take PO mucinex and didn't not vomit.

## 2016-07-09 NOTE — Progress Notes (Signed)
Patient arrive to unit via wc. Orinted patient to unit. Bed alarm on.

## 2016-07-09 NOTE — ED Provider Notes (Signed)
[facility]-EMERGENCY DEPT Provider Note   CSN: [id number] Arrival date & time: 07/08/16  2205  By signing my name below, I, Ian Terry, attest that this documentation has been prepared under the direction and in the presence of Dione Boozeavid Andrian Urbach, MD. Electronically Signed: Sandrea HammondStephen Terry, ED Scribe. 07/09/16. 3:06 AM.   History   Chief Complaint Chief Complaint  Patient presents with  . Asthma  . Cough    HPI Comments: Ian Terry is a 30 y.o. male who presents to the Emergency Department complaining of SOB with a burning sensation in his throat that started yesterday morning (09/25). Pt has never experienced these symptoms before. Pt was given Tylenol and supplemental oxygen via face mask on arrival in the ED and says that these treatments have helped his symptoms. He reports dry cough, fever (103.85F), headache, and chills. Pt says he believes his headache is due to his coughing. Pt says he does not use an inhaler at home. Pt says he smokes a cigarette only occasionally and does not use EtOH.   The history is provided by the patient. No language interpreter was used.    Past Medical History:  Diagnosis Date  . Asthma     Patient Active Problem List   Diagnosis Date Noted  . PNEUMONIA, RIGHT LOWER LOBE 02/01/2008  . ASTHMA 02/01/2008    Past Surgical History:  Procedure Laterality Date  . EYE SURGERY         Home Medications    Prior to Admission medications   Medication Sig Start Date End Date Taking? Authorizing Provider  albuterol (PROVENTIL HFA;VENTOLIN HFA) 108 (90 Base) MCG/ACT inhaler Inhale 2 puffs into the lungs every 6 (six) hours as needed for wheezing or shortness of breath.    Historical Provider, MD  ibuprofen (ADVIL,MOTRIN) 800 MG tablet Take 1 tablet (800 mg total) by mouth every 8 (eight) hours as needed for moderate pain. Patient not taking: Reported on 12/05/2015 08/11/15   Fayrene HelperBowie Tran, PA-C  methocarbamol (ROBAXIN) 500 MG tablet Take 1 tablet (500 mg  total) by mouth 2 (two) times daily. Patient not taking: Reported on 12/05/2015 08/11/15   Fayrene HelperBowie Tran, PA-C  Phenylephrine-Pheniramine-DM [facility](THERAFLU COLD & COUGH PO) Take 1 packet by mouth 2 (two) times daily as needed (flu symptoms).    Historical Provider, MD  predniSONE (DELTASONE) 20 MG tablet Take 40 mg by mouth daily for 3 days, then 20mg  by mouth daily for 3 days, then 10mg  daily for 3 days 12/05/15   Garlon HatchetLisa M Sanders, PA-C    Family History No family history on file.  Social History Social History  Substance Use Topics  . Smoking status: Never Smoker  . Smokeless tobacco: Never Used  . Alcohol use No     Allergies   Review of patient's allergies indicates no known allergies.   Review of Systems Review of Systems  Constitutional: Positive for chills and fever.  HENT: Positive for sore throat.   Respiratory: Positive for cough and shortness of breath.   Neurological: Positive for headaches.  All other systems reviewed and are negative.    Physical Exam Updated Vital Signs BP 129/72 (BP Location: Left Arm)   Pulse 96   Temp 101.4 F (38.6 C) (Oral)   Resp 20   Ht 5\' 7"  (1.702 m)   Wt 180 lb (81.6 kg)   SpO2 99%   BMI 28.19 kg/m   Physical Exam  Constitutional: He is oriented to person, place, and time. He appears well-developed and well-nourished.  HENT:  Head: Normocephalic and atraumatic.  Pharynx shows mild erythema  Eyes: EOM are normal. Pupils are equal, round, and reactive to light.  Neck: Normal range of motion. Neck supple. No JVD present.  Cardiovascular: Normal rate, regular rhythm and normal heart sounds.   No murmur heard. Pulmonary/Chest: He has wheezes. He has no rales. He exhibits no tenderness.  Mild expiratory wheezing diffusely  Abdominal: Soft. Bowel sounds are normal. He exhibits no distension and no mass. There is no tenderness.  Musculoskeletal: Normal range of motion. He exhibits no edema.  Lymphadenopathy:    He has no cervical  adenopathy.  Neurological: He is alert and oriented to person, place, and time. No cranial nerve deficit. He exhibits normal muscle tone. Coordination normal.  Skin: Skin is warm and dry. No rash noted.  Psychiatric: He has a normal mood and affect. His behavior is normal. Judgment and thought content normal.  Nursing note and vitals reviewed.    ED Treatments / Results   DIAGNOSTIC STUDIES: Oxygen Saturation is 99% on supplemental oxygen via face mask, normal by my interpretation.    COORDINATION OF CARE: 2:54 AM Discussed treatment plan with pt at bedside and pt agreed to plan.   Labs (all labs ordered are listed, but only abnormal results are displayed) Labs Reviewed  BASIC METABOLIC PANEL - Abnormal; Notable for the following:       Result Value   Glucose, Bld 104 (*)    Creatinine, Ser 1.28 (*)    Calcium 8.6 (*)    All other components within normal limits  CBC WITH DIFFERENTIAL/PLATELET    Radiology Dg Chest 2 View  Result Date: 07/08/2016 CLINICAL DATA:  30 year old male with asthma and wheezing. EXAM: CHEST  2 VIEW COMPARISON:  Chest radiograph dated 12/05/2015 FINDINGS: The heart size and mediastinal contours are within normal limits. Both lungs are clear. The visualized skeletal structures are unremarkable. IMPRESSION: No active cardiopulmonary disease. Electronically Signed   By: Elgie Collard M.D.   On: 07/08/2016 23:38    Procedures Procedures (including critical care time)  Medications Ordered in ED Medications  predniSONE (DELTASONE) tablet 60 mg (not administered)  albuterol (PROVENTIL) (2.5 MG/3ML) 0.083% nebulizer solution 5 mg (5 mg Nebulization Given 07/08/16 2216)  acetaminophen (TYLENOL) tablet 650 mg (650 mg Oral Given 07/08/16 2216)  ipratropium-albuterol (DUONEB) 0.5-2.5 (3) MG/3ML nebulizer solution 3 mL (3 mLs Nebulization Given 07/09/16 0242)     Initial Impression / Assessment and Plan / ED Course  I have reviewed the triage vital signs  and the nursing notes.  Pertinent labs & imaging results that were available during my care of the patient were reviewed by me and considered in my medical decision making (see chart for details).  Clinical Course   Asthma exacerbation. Patient does not appear to be in distress, but still had significant wheezing following to nebulizer treatments in the ED. Also, he is complaining of sore throat and strep screen is ordered. Chest x-ray had been obtained showing no evidence of pneumonia. Old records are reviewed confirming prior ED visits for asthma. He is given a third nebulizer treatment as well as a dose of oral prednisone. Following this, he was sleeping, but lungs still had considerable wheezing. It was felt that he was not stable to go home as a failure to respond. Chest x-ray showed no evidence of pneumonia. Case is discussed with Dr. Katrinka Blazing of [facility] who agrees to admit the patient under observation status.  Final Clinical Impressions(s) /  ED Diagnoses   Final diagnoses:  Wheezing  Cough  Chest congestion  Fever  Asthma exacerbation    New Prescriptions New Prescriptions   No medications on file   I personally performed the services described in this documentation, which was scribed in my presence. The recorded information has been reviewed and is accurate.        Dione Booze, MD 07/09/16 [id number]

## 2016-07-09 NOTE — ED Notes (Signed)
Pt receiving breathing tx at this time 

## 2016-07-09 NOTE — Progress Notes (Signed)
Pt tolerated yogurt, pt then ate lunch meal. No nausea

## 2016-07-09 NOTE — Progress Notes (Signed)
After vomiting pt told not to eat anything, tolerated gingerale well. Giving yogurt now for pt to eat.

## 2016-07-09 NOTE — H&P (Signed)
History and Physical    Ian Terry [id number] DOB: 03-22-1986 DOA: 07/09/2016  Referring MD/NP/PA: Dr. Preston FleetingGlick PCP: No primary care provider on file.  Patient coming from: Home  Chief Complaint: My throat hurts  HPI: Ian Terry is a 30 y.o. male with medical history significant of  asthma; who presents with complaints of sore throat. Patient notes that he woke up yesterday morning (9/25) with a constant scratchy burning sensation in his throat. Associated symptoms included cough, wheezing, nausea, vomiting, fever up to 103F, headache, and malaise. Denies any known sick contacts. He tried using his nebulizer machine at home without relief of symptoms.  ED Course: The emergency department patient was noted have a temperature up to 101.38F, heart rates up to 120, respirations up to 26, and O2 saturations maintained on room air. Lab work was relatively unremarkable except for rapid strep which came back positive for group A strep. While in the ED the patient had been given Tylenol and albuterol nebulizer treatments with mild improvement of symptoms. [facility] called to admit.   Review of Systems: As per HPI otherwise 10 point review of systems negative.   Past Medical History:  Diagnosis Date  . Asthma     Past Surgical History:  Procedure Laterality Date  . EYE SURGERY       reports that he has never smoked. He has never used smokeless tobacco. He reports that he does not drink alcohol or use drugs.  No Known Allergies  No family history on file.  Prior to Admission medications   Not on File    Physical Exam:    Constitutional: Young man who appears acutely sick in on respiratory distress Vitals:   07/09/16 0315 07/09/16 0345 07/09/16 0415 07/09/16 0445  BP: 111/70 127/80 (!) 99/50 109/59  Pulse: 112 99 120 112  Resp:      Temp:      TempSrc:      SpO2: 97% 100% 93% 92%  Weight:      Height:       Eyes: PERRL, lids and conjunctivae normal ENMT: Mucous membranes  are moist. Posterior pharynx clear of any exudate or lesions.Normal dentition.  Neck: normal, supple, no masses, no thyromegaly Respiratory: Bilateral wheezes appreciated. Normal respiratory effort. No accessory muscle use.  Cardiovascular:Tachycardic, no murmurs / rubs / gallops. No extremity edema. 2+ pedal pulses. No carotid bruits.  Abdomen: no tenderness, no masses palpated. No hepatosplenomegaly. Bowel sounds positive.  Musculoskeletal: no clubbing / cyanosis. No joint deformity upper and lower extremities. Good ROM, no contractures. Normal muscle tone.  Skin: Diaphoretic, no rashes or lesions appreciated Neurologic: CN 2-12 grossly intact. Sensation intact, DTR normal. Strength 5/5 in all 4.  Psychiatric: Normal judgment and insight. Alert and oriented x 3. Normal mood.     Labs on Admission: I have personally reviewed following labs and imaging studies  CBC:  Recent Labs Lab 07/08/16 2213  WBC 7.7  NEUTROABS 5.0  HGB 14.5  HCT 43.8  MCV 90.3  PLT 238   Basic Metabolic Panel:  Recent Labs Lab 07/08/16 2213  NA 137  K 3.7  CL 107  CO2 23  GLUCOSE 104*  BUN 16  CREATININE 1.28*  CALCIUM 8.6*   GFR: Estimated Creatinine Clearance: 87.1 mL/min (by C-G formula based on SCr of 1.28 mg/dL (H)). Liver Function Tests: No results for input(s): AST, ALT, ALKPHOS, BILITOT, PROT, ALBUMIN in the last 168 hours. No results for input(s): LIPASE, AMYLASE in the last 168 hours.  No results for input(s): AMMONIA in the last 168 hours. Coagulation Profile: No results for input(s): INR, PROTIME in the last 168 hours. Cardiac Enzymes: No results for input(s): CKTOTAL, CKMB, CKMBINDEX, TROPONINI in the last 168 hours. BNP (last 3 results) No results for input(s): PROBNP in the last 8760 hours. HbA1C: No results for input(s): HGBA1C in the last 72 hours. CBG: No results for input(s): GLUCAP in the last 168 hours. Lipid Profile: No results for input(s): CHOL, HDL, LDLCALC, TRIG,  CHOLHDL, LDLDIRECT in the last 72 hours. Thyroid Function Tests: No results for input(s): TSH, T4TOTAL, FREET4, T3FREE, THYROIDAB in the last 72 hours. Anemia Panel: No results for input(s): VITAMINB12, FOLATE, FERRITIN, TIBC, IRON, RETICCTPCT in the last 72 hours. Urine analysis:    Component Value Date/Time   COLORURINE YELLOW 11/20/2007 2250   APPEARANCEUR CLEAR 11/20/2007 2250   LABSPEC 1.037 (H) 11/20/2007 2250   PHURINE 6.0 11/20/2007 2250   GLUCOSEU NEGATIVE 11/20/2007 2250   HGBUR NEGATIVE 11/20/2007 2250   BILIRUBINUR NEGATIVE 11/20/2007 2250   KETONESUR 15 (A) 11/20/2007 2250   PROTEINUR 30 (A) 11/20/2007 2250   UROBILINOGEN 1.0 11/20/2007 2250   NITRITE NEGATIVE 11/20/2007 2250   LEUKOCYTESUR NEGATIVE 11/20/2007 2250   Sepsis Labs: Recent Results (from the past 240 hour(s))  Rapid strep screen (not at [facility])     Status: Abnormal   Collection Time: 07/09/16  4:59 AM  Result Value Ref Range Status   Streptococcus, Group A Screen (Direct) POSITIVE (A) NEGATIVE Final     Radiological Exams on Admission: Dg Chest 2 View  Result Date: 07/08/2016 CLINICAL DATA:  30 year old male with asthma and wheezing. EXAM: CHEST  2 VIEW COMPARISON:  Chest radiograph dated 12/05/2015 FINDINGS: The heart size and mediastinal contours are within normal limits. Both lungs are clear. The visualized skeletal structures are unremarkable. IMPRESSION: No active cardiopulmonary disease. Electronically Signed   By: Elgie Collard M.D.   On: 07/08/2016 23:38     Assessment/Plan SIRS/Sepsis 2/2 Group A strep: Acute.   - Admit to a MedSurg bed - Sepsis protocol initiated - Follow-up cultures - Oral Penicillin V 500 mg twice a day x 10days - Tylenol prn fever  Asthma exacerbation - DuoNeb's q4hr prn SOB or Wheezing - Prednisone 40 mg po daily   Tobacco use: -Will need to counsel on the need for cessation of tobacco   DVT prophylaxis:  Lovenox  Code Status:  full  Family Communication:  No family present at bedside Disposition Plan: Likely discharge home once medically stable   Consults called: none  Admission status: Observation MedSurg   Clydie Braun MD [facility] Pager [phone]  If 7PM-7AM, please contact night-coverage [email or web address] Password [email or web address]  07/09/2016, 6:01 AM

## 2016-07-10 DIAGNOSIS — J45901 Unspecified asthma with (acute) exacerbation: Secondary | ICD-10-CM

## 2016-07-10 DIAGNOSIS — J020 Streptococcal pharyngitis: Secondary | ICD-10-CM

## 2016-07-10 DIAGNOSIS — A400 Sepsis due to streptococcus, group A: Principal | ICD-10-CM

## 2016-07-10 DIAGNOSIS — E872 Acidosis: Secondary | ICD-10-CM

## 2016-07-10 LAB — GLUCOSE, CAPILLARY
GLUCOSE-CAPILLARY: 165 mg/dL — AB (ref 65–99)
Glucose-Capillary: 161 mg/dL — ABNORMAL HIGH (ref 65–99)
Glucose-Capillary: 164 mg/dL — ABNORMAL HIGH (ref 65–99)

## 2016-07-10 LAB — COMPREHENSIVE METABOLIC PANEL
ALK PHOS: 52 U/L (ref 38–126)
ALT: 17 U/L (ref 17–63)
AST: 23 U/L (ref 15–41)
Albumin: 3.6 g/dL (ref 3.5–5.0)
Anion gap: 13 (ref 5–15)
BUN: 12 mg/dL (ref 6–20)
CALCIUM: 9.3 mg/dL (ref 8.9–10.3)
CO2: 22 mmol/L (ref 22–32)
CREATININE: 1.07 mg/dL (ref 0.61–1.24)
Chloride: 103 mmol/L (ref 101–111)
Glucose, Bld: 172 mg/dL — ABNORMAL HIGH (ref 65–99)
Potassium: 3.8 mmol/L (ref 3.5–5.1)
Sodium: 138 mmol/L (ref 135–145)
TOTAL PROTEIN: 7.4 g/dL (ref 6.5–8.1)
Total Bilirubin: 0.5 mg/dL (ref 0.3–1.2)

## 2016-07-10 LAB — CBC WITH DIFFERENTIAL/PLATELET
Basophils Absolute: 0 10*3/uL (ref 0.0–0.1)
Basophils Relative: 0 %
EOS PCT: 0 %
Eosinophils Absolute: 0 10*3/uL (ref 0.0–0.7)
HCT: 46 % (ref 39.0–52.0)
HEMOGLOBIN: 15.4 g/dL (ref 13.0–17.0)
LYMPHS ABS: 1.8 10*3/uL (ref 0.7–4.0)
LYMPHS PCT: 11 %
MCH: 30.4 pg (ref 26.0–34.0)
MCHC: 33.5 g/dL (ref 30.0–36.0)
MCV: 90.7 fL (ref 78.0–100.0)
MONOS PCT: 9 %
Monocytes Absolute: 1.5 10*3/uL — ABNORMAL HIGH (ref 0.1–1.0)
NEUTROS PCT: 80 %
Neutro Abs: 13.1 10*3/uL — ABNORMAL HIGH (ref 1.7–7.7)
Platelets: 263 10*3/uL (ref 150–400)
RBC: 5.07 MIL/uL (ref 4.22–5.81)
RDW: 12.2 % (ref 11.5–15.5)
WBC: 16.3 10*3/uL — AB (ref 4.0–10.5)

## 2016-07-10 LAB — LACTIC ACID, PLASMA
Lactic Acid, Venous: 2 mmol/L (ref 0.5–1.9)
Lactic Acid, Venous: 2.6 mmol/L (ref 0.5–1.9)
Lactic Acid, Venous: 2.4 mmol/L (ref 0.5–1.9)
Lactic Acid, Venous: 3 mmol/L (ref 0.5–1.9)

## 2016-07-10 LAB — MAGNESIUM: MAGNESIUM: 2.3 mg/dL (ref 1.7–2.4)

## 2016-07-10 LAB — PHOSPHORUS: PHOSPHORUS: 2.1 mg/dL — AB (ref 2.5–4.6)

## 2016-07-10 MED ORDER — SODIUM CHLORIDE 0.9 % IV BOLUS (SEPSIS)
1000.0000 mL | Freq: Once | INTRAVENOUS | Status: AC
  Administered 2016-07-10: 1000 mL via INTRAVENOUS

## 2016-07-10 MED ORDER — IPRATROPIUM-ALBUTEROL 0.5-2.5 (3) MG/3ML IN SOLN
3.0000 mL | Freq: Four times a day (QID) | RESPIRATORY_TRACT | Status: DC
  Administered 2016-07-10 – 2016-07-11 (×5): 3 mL via RESPIRATORY_TRACT
  Filled 2016-07-10 (×5): qty 3

## 2016-07-10 MED ORDER — ALBUTEROL SULFATE (2.5 MG/3ML) 0.083% IN NEBU: 2.5000 mg | INHALATION_SOLUTION | RESPIRATORY_TRACT | Status: DC | PRN

## 2016-07-10 MED ORDER — ALBUTEROL SULFATE (2.5 MG/3ML) 0.083% IN NEBU: 2.5000 mg | INHALATION_SOLUTION | Freq: Four times a day (QID) | RESPIRATORY_TRACT | Status: DC

## 2016-07-10 MED ORDER — K PHOS MONO-SOD PHOS DI & MONO 155-852-130 MG PO TABS
250.0000 mg | ORAL_TABLET | Freq: Two times a day (BID) | ORAL | Status: AC
  Administered 2016-07-10 (×2): 250 mg via ORAL
  Filled 2016-07-10 (×2): qty 1

## 2016-07-10 NOTE — Progress Notes (Signed)
Lactic acid 3.0, MD paged. Received new orders for 1L of bolus. Will continue to monitor pt.

## 2016-07-10 NOTE — Progress Notes (Signed)
PROGRESS NOTE    Ian Terry  [id number] DOB: Jan 11, 1986 DOA: 07/09/2016 PCP: No primary care provider on file.   Brief Narrative:  Patient is a 30 year old African American male with a PMH of Tobacco Abuse, Asthma, Blindness in his Right Eye, and other co-moribidities who [facility] with a chief complaint of his throat hurting and had a scratchy/burning sensation. He was worked up and admitted and found to be septic 2/2 to Group A Strep infection (GABHS). He was also noted to have an Asthma exacerbation and admitted for treatment.   Assessment & Plan:   Principal Problem:   Sepsis (HCC) Active Problems:   Lactic Acidosis   Asthma exacerbation   Streptococcal sore throat   Tobacco Abuse  ASSESSMENT  1. Sepsis 2/2 to Acute GABHS infection  2. Lactic Acidosis 2/2 to #1 3. Acute Exacerbation of Asthma in the presence of Acute GABHS infection 4. Leukocytosis, likely Recent GABHS infection and concomitant Demarginalization from IV Steroid Use.  5. Hyperglycemia in the setting of IV Steroid Use 6. Hypophosphetemia  7. Tobacco Abuse 8. Right Eye Blindness  PLAN  1. Sepsis 2/2 to Acute GABHS infection  -Patient was febrile, Tachycardic, and Tachypenic with Source of Infection being GABHS. Sepsis Protocol initiated. Continue with IV Ceftriaxone today.  -Will likely Switch to Pen V 500 mg po BID x 10 days or Azithromycin 500 mg po on Day 1 and 250 mg of Azithromycin Day's 2-5.  -Continue IV Methylprednisolone 60 mg q6h for Wheezing and wean as appropriate.  -Continue Acetaminophen 650 mg po q6h prn for Mild Pain and Fever >101.  -Added DuoNebs 3 mL q6h scheduled. C/w nebulized Albuterol q2hprn for wheezing and with Nebulized Ipratropium 0.4 mg q4hprn. Repeat CXR in Am.  -Continue with Guaifenesin 600 mg po BID -Blood Cx's pending.  -Last Lactic Acid Venous was 2.3; Repeat was 2.0 and then went to 2.6. Repeat Lactic Acid and then another q3h for Sepsis.    -C/w IVF NS 0.9% @ 100 mL/hr and monitoring.  -Consider CXR in Am, now that patient is adequately hydrated.   2. Lactic Acidosis 2/2 #1 -Last Lactic Acid Venous was 2.3. Repeat Lactic Acid was 2.0 and went to 2.6. -Bolus NS 0.9% 1,000 mL over an hour -C/w NS 0.9% at 100 mL/hr -Repeat Lactic Acid Level  3. Acute Exacerbation of Asthma in the presence of Acute GABHS infection -Continue IV Methylprednisolone 60 mg q6h for Wheezing and wean as appropriate.  -Added DuoNebs 3 mL q6h scheduled. C/w nebulized Albuterol q2hprn for wheezing and with Nebulized Ipratropium 0.4 mg q4hprn.  -Will likely check IgE level if patient does not improve.   4. Leukocytosis, likely from Recent GABHS infection and concomitant Demarginalization from Steroid Use   -Patient's WBC went from 7.7 -> 16.3.  -Neutrophil Abs was up to 13.1 from 5.0.  -Continue IV Methylprednisolone 60 mg q6h for Wheezing and wean as appropriate.   5. Hyperglycemia in the setting of IV Steroid Use and Sepsis -Patient's Glucose was 104 and 172 on BMP -Will monitor CBG AC and QHS and if necessary add IV insulin to regimen to control Bs.  -Check HbA1c -Continue to Monitor.   6. Hypophosphetemia -Patient's Phos level was 2.1 -Repleted with Kphos 250 mg po X1  -Recheck Phos level in AM.  7. Tobacco Abuse -Smoking Cessation Counseling given. Patient refused Nicotine Replacement at this time.   8. Right Eye Blindness -Stable and Chronic. No Active Issues at  this time.   DVT prophylaxis: Lovenox 40 mg sq q24h Code Status: Full Family Communication: No family present during encounter Disposition Plan: Home likely tomorrow  Consultants: None Procedures: None Antimicrobials:  IV Ceftriaxone started at 9:00 AM on 07/09/16. Will switch to po Abx when patient stops vomiting.   Subjective: This morning when the patient was examined he states he was still very wheezy and that this throat was still hurting. Denied any Nausea/Vomiting,  Cp, but did admit to having mild shortness of breath. No other current symptoms. Thinks that Mucinex is helping his symptoms. Denied any fevers overnight.   Objective: Vitals:   07/09/16 2150 07/10/16 0140 07/10/16 0554 07/10/16 0900  BP:  (!) 121/56 126/64 122/65  Pulse:  (!) 112 (!) 104 93  Resp:  18 18 19   Temp:  98.7 F (37.1 C) 98.3 F (36.8 C) 98.4 F (36.9 C)  TempSrc:  Oral Oral Oral  SpO2: 97% 95% 98% 96%  Weight:      Height:        Intake/Output Summary (Last 24 hours) at 07/10/16 1038 Last data filed at 07/09/16 1049  Gross per 24 hour  Intake                0 ml  Output                0 ml  Net                0 ml   Filed Weights   07/08/16 2211 07/09/16 1610  Weight: 81.6 kg (180 lb) 84.4 kg (186 lb 1.6 oz)    Examination: Physical Exam:  Constitutional: WN/WD, NAD and appears calm and comfortable sitting in chair watching television Eyes: PERRL, lids and conjunctivae normal, sclerae anicteric  ENMT: External Ears, Nose appear normal. Grossly normal hearing. Posterior pharynx clear slightly erythematous. Normal dentition.  Neck: Appears normal, supple, no cervical masses, normal ROM, no appreciable thyromegaly Respiratory: Diffuse expiratory wheezing noted throughout Lung fields. Normal respiratory effort and patient is not tachypenic. No accessory muscle use.  Cardiovascular:  Slightly tachycardic rate, no murmurs / rubs / gallops. S1 and S2 auscultated. No extremity edema. 2+ pedal pulses. No carotid bruits.  Abdomen: Soft, non-tender, distended 2/2 body habitus. No masses palpated. No appreciable hepatosplenomegaly. Bowel sounds positive x4.  GU: Deferred. Musculoskeletal: No clubbing / cyanosis of digits/nails. No joint deformity upper and lower extremities. Good ROM, no contractures. Normal strength and muscle tone.  Skin: No rashes, lesions, ulcers. No induration; Warm and dry.  Neurologic: CN 2-12 grossly intact with no focal deficits. Sensation intact  in all 4 Extremities. Strength 5/5 in all 4. Romberg sign cerebellar reflexes not assessed.  Psychiatric: Normal judgment and insight. Alert and oriented x 3. Normal mood and appropriate affect.   Data Reviewed:   CBC:  Recent Labs Lab 07/08/16 2213 07/10/16 0733  WBC 7.7 16.3*  NEUTROABS 5.0 13.1*  HGB 14.5 15.4  HCT 43.8 46.0  MCV 90.3 90.7  PLT 238 263   Basic Metabolic Panel:  Recent Labs Lab 07/08/16 2213 07/10/16 0733  NA 137 138  K 3.7 3.8  CL 107 103  CO2 23 22  GLUCOSE 104* 172*  BUN 16 12  CREATININE 1.28* 1.07  CALCIUM 8.6* 9.3  MG  --  2.3  PHOS  --  2.1*   GFR: Estimated Creatinine Clearance: 105.8 mL/min (by C-G formula based on SCr of 1.07 mg/dL). Liver Function Tests:  Recent Labs Lab  07/10/16 0733  AST 23  ALT 17  ALKPHOS 52  BILITOT 0.5  PROT 7.4  ALBUMIN 3.6   No results for input(s): LIPASE, AMYLASE in the last 168 hours. No results for input(s): AMMONIA in the last 168 hours. Coagulation Profile: No results for input(s): INR, PROTIME in the last 168 hours. Cardiac Enzymes: No results for input(s): CKTOTAL, CKMB, CKMBINDEX, TROPONINI in the last 168 hours. BNP (last 3 results) No results for input(s): PROBNP in the last 8760 hours. HbA1C: No results for input(s): HGBA1C in the last 72 hours. CBG: No results for input(s): GLUCAP in the last 168 hours. Lipid Profile: No results for input(s): CHOL, HDL, LDLCALC, TRIG, CHOLHDL, LDLDIRECT in the last 72 hours. Thyroid Function Tests: No results for input(s): TSH, T4TOTAL, FREET4, T3FREE, THYROIDAB in the last 72 hours. Anemia Panel: No results for input(s): VITAMINB12, FOLATE, FERRITIN, TIBC, IRON, RETICCTPCT in the last 72 hours. Sepsis Labs:  Recent Labs Lab 07/09/16 4098  LATICACIDVEN 2.3*    Recent Results (from the past 240 hour(s))  Rapid strep screen (not at [facility])     Status: Abnormal   Collection Time: 07/09/16  4:59 AM  Result Value Ref Range Status    Streptococcus, Group A Screen (Direct) POSITIVE (A) NEGATIVE Final     Radiology Studies: Dg Chest 2 View  Result Date: 07/08/2016 CLINICAL DATA:  30 year old male with asthma and wheezing. EXAM: CHEST  2 VIEW COMPARISON:  Chest radiograph dated 12/05/2015 FINDINGS: The heart size and mediastinal contours are within normal limits. Both lungs are clear. The visualized skeletal structures are unremarkable. IMPRESSION: No active cardiopulmonary disease. Electronically Signed   By: Elgie Collard M.D.   On: 07/08/2016 23:38    Scheduled Meds: . cefTRIAXone (ROCEPHIN)  IV  2 g Intravenous Q24H  . enoxaparin (LOVENOX) injection  40 mg Subcutaneous Q24H  . guaiFENesin  600 mg Oral BID  . methylPREDNISolone (SOLU-MEDROL) injection  60 mg Intravenous Q6H   Continuous Infusions: . sodium chloride 100 mL/hr at 07/09/16 0832     LOS: 1 day   Merlene Laughter, DO [facility] Pager [phone]  If 7PM-7AM, please contact night-coverage [email or web address] Password [facility] 07/10/2016, 10:38 AM

## 2016-07-10 NOTE — Progress Notes (Signed)
Lactic acid- 2.6. MD paged.

## 2016-07-11 ENCOUNTER — Inpatient Hospital Stay (HOSPITAL_COMMUNITY): Payer: Self-pay

## 2016-07-11 LAB — LACTIC ACID, PLASMA
Lactic Acid, Venous: 1.4 mmol/L (ref 0.5–1.9)
Lactic Acid, Venous: 1.8 mmol/L (ref 0.5–1.9)

## 2016-07-11 LAB — CBC WITH DIFFERENTIAL/PLATELET
BASOS PCT: 0 %
Basophils Absolute: 0 10*3/uL (ref 0.0–0.1)
Eosinophils Absolute: 0 10*3/uL (ref 0.0–0.7)
Eosinophils Relative: 0 %
HEMATOCRIT: 44.7 % (ref 39.0–52.0)
Hemoglobin: 14.7 g/dL (ref 13.0–17.0)
LYMPHS ABS: 1.9 10*3/uL (ref 0.7–4.0)
LYMPHS PCT: 8 %
MCH: 30 pg (ref 26.0–34.0)
MCHC: 32.9 g/dL (ref 30.0–36.0)
MCV: 91.2 fL (ref 78.0–100.0)
MONO ABS: 1.3 10*3/uL — AB (ref 0.1–1.0)
MONOS PCT: 5 %
NEUTROS ABS: 20.9 10*3/uL — AB (ref 1.7–7.7)
Neutrophils Relative %: 87 %
Platelets: 262 10*3/uL (ref 150–400)
RBC: 4.9 MIL/uL (ref 4.22–5.81)
RDW: 12.2 % (ref 11.5–15.5)
WBC: 24.1 10*3/uL — ABNORMAL HIGH (ref 4.0–10.5)

## 2016-07-11 LAB — BASIC METABOLIC PANEL
ANION GAP: 7 (ref 5–15)
BUN: 12 mg/dL (ref 6–20)
CALCIUM: 8.6 mg/dL — AB (ref 8.9–10.3)
CO2: 25 mmol/L (ref 22–32)
CREATININE: 0.96 mg/dL (ref 0.61–1.24)
Chloride: 105 mmol/L (ref 101–111)
Glucose, Bld: 166 mg/dL — ABNORMAL HIGH (ref 65–99)
Potassium: 3.6 mmol/L (ref 3.5–5.1)
SODIUM: 137 mmol/L (ref 135–145)

## 2016-07-11 LAB — GLUCOSE, CAPILLARY
GLUCOSE-CAPILLARY: 178 mg/dL — AB (ref 65–99)
Glucose-Capillary: 119 mg/dL — ABNORMAL HIGH (ref 65–99)

## 2016-07-11 LAB — PHOSPHORUS: Phosphorus: 3.5 mg/dL (ref 2.5–4.6)

## 2016-07-11 LAB — MAGNESIUM: Magnesium: 2 mg/dL (ref 1.7–2.4)

## 2016-07-11 MED ORDER — PENICILLIN V POTASSIUM 500 MG PO TABS: 500.0000 mg | ORAL_TABLET | Freq: Two times a day (BID) | ORAL | 0 refills | Status: DC

## 2016-07-11 MED ORDER — PREDNISONE 20 MG PO TABS
40.0000 mg | ORAL_TABLET | Freq: Two times a day (BID) | ORAL | Status: DC
  Administered 2016-07-11: 40 mg via ORAL
  Filled 2016-07-11: qty 2

## 2016-07-11 MED ORDER — GUAIFENESIN ER 600 MG PO TB12: 600.0000 mg | ORAL_TABLET | Freq: Two times a day (BID) | ORAL | 0 refills | Status: DC

## 2016-07-11 MED ORDER — AEROCHAMBER PLUS MISC: 2 refills | Status: DC

## 2016-07-11 MED ORDER — PENICILLIN V POTASSIUM 250 MG PO TABS
500.0000 mg | ORAL_TABLET | Freq: Two times a day (BID) | ORAL | Status: DC
  Administered 2016-07-11: 500 mg via ORAL
  Filled 2016-07-11: qty 2
  Filled 2016-07-11: qty 1

## 2016-07-11 MED ORDER — ALBUTEROL SULFATE HFA 108 (90 BASE) MCG/ACT IN AERS: 2.0000 | INHALATION_SPRAY | Freq: Four times a day (QID) | RESPIRATORY_TRACT | 2 refills | Status: DC | PRN

## 2016-07-11 MED ORDER — ALBUTEROL SULFATE (2.5 MG/3ML) 0.083% IN NEBU: 2.5000 mg | INHALATION_SOLUTION | RESPIRATORY_TRACT | 12 refills | Status: DC | PRN

## 2016-07-11 MED ORDER — IPRATROPIUM-ALBUTEROL 0.5-2.5 (3) MG/3ML IN SOLN: 3.0000 mL | Freq: Four times a day (QID) | RESPIRATORY_TRACT | 1 refills | Status: DC | PRN

## 2016-07-11 MED ORDER — PREDNISONE 10 MG (21) PO TBPK: 10.0000 mg | ORAL_TABLET | Freq: Every day | ORAL | 0 refills | Status: DC

## 2016-07-11 NOTE — Care Management Note (Signed)
Case Management Note  Patient Details  Name: Ian Terry MRN: [id number] Date of Birth: 22-Mar-1986  Subjective/Objective:                    Action/Plan: Pt discharging home with self care. CM consulted for PCP and medication assistance. CM met with the patient and provide him a flyer for [facility]. Pt interested in attending but CM unable to obtain him an appointment until Monday. Pt to call Monday and make an appointment. CM also went over the use of the [facility] pharmacy. Pt discharging with multiple medications, some of which are expensive. CM provided the patient with a [place] letter to assist him in affording his medications and provided him a list of pharmacies that accepts the letter.   Expected Discharge Date:                  Expected Discharge Plan:  Home/Self Care  In-House Referral:     Discharge planning Services  CM Consult  Post Acute Care Choice:    Choice offered to:     DME Arranged:    DME Agency:     HH Arranged:    [place] Agency:     Status of Service:  Completed, signed off  If discussed at [employer] of Stay Meetings, dates discussed:    Additional Comments:  Pollie Friar, RN 07/11/2016, 2:29 PM

## 2016-07-11 NOTE — Discharge Summary (Signed)
Physician Discharge Summary  Ian Terry:[id number] DOB: October 24, 1985 DOA: 07/09/2016  PCP: No primary care provider on file. Will setup at [facility].  Admit date: 07/09/2016 Discharge date: 07/11/2016  Admitted From: Home Disposition: Home   Recommendations for Outpatient Follow-up:  1. Follow up with [facility]CP in 1 week 2. Please obtain BMP/CBC in one week to check WBC 3. Please see Pulmonary Dr. Thurston Hole office on 07/15/2016 at 1:30 pm  Home Health: No  Equipment/Devices: Home Nebulizer Treatments  Discharge Condition: Stable and Improved  CODE STATUS: FULL  Diet recommendation: Regular Diet.   Brief/Interim Summary: Patient is a 30 year old African American male with a PMH of Tobacco Abuse, Asthma, Blindness in his Right Eye, and other co-moribidities who presented to [facility] on 07/09/16 with a chief complaint of his throat hurting and had a scratchy/burning sensation. He was worked up and admitted and found to be septic 2/2 to Group A Strep infection (GABHS). He was also noted to have an Asthma exacerbation and admitted for treatment. He steadily improved and was afebrile overnight. Patient is at this time was deemed medically stable to be D/C'd and he will follow up with the [facility] for his Primary Care needs and will be setup with Pulmonary to have his Asthma followed and evaluated.   Discharge Diagnoses:  Principal Problem:   Sepsis (HCC) Active Problems:   Asthma exacerbation   Streptococcal sore throat  ASSESSMENT  1. Sepsis 2/2 to Acute GABHS infection, Improved. 2. Lactic Acidosis 2/2 to #1, Resolved. 3. Acute Exacerbation of Asthma in the presence of Acute GABHS infection, Improving 4. Leukocytosis, likely from Demarginalization from IV Steroid Use.  5. Hyperglycemia in the setting of IV Steroid Use 6. Hypophosphetemia, Improved.   7. Tobacco Abuse 8. Right Eye Blindness  PLAN  1. Sepsis 2/2 to Acute GABHS  infection, Improved -No longer Septic.  -Switched from IV Ceftriaxone to Pen V 500 mg po BID for a total of 10 days.  -D/C'd IV Methylprednisolone 60 mg q6h for wheezing and started 40 mg BID Deltasone; Provided Patient with Sterapred Dose Pack  -Continue Acetaminophen 650 mg po q6h prn for Mild Pain and Fever >101.  -Repeat CXR this AM showed no pneumonia or CHF but had stable bibasilar scarring.  -Continue with Guaifenesin 600 mg po BID x 7 days as needed.  -Blood Cx's showed No growth x 1 day x 2 sets.  -Repeat Lactic Acid was 1.4 and 1.8. -D/C'd IVF NS 0.9% @ 100 mL/hr and monitoring.   2. Lactic Acidosis 2/2 #1, Resolved -Las 2 Lactic Acid weer 1.4 and 1.8. -D/C'd NS 0.9% at 100 mL/hr  3. Acute Exacerbation of Asthma in the presence of Acute GABHS infection -D/C'd IV Methylprednisolone 60 mg q6h for wheezing and started 40 mg BID Deltasone; Provided Patient with Sterapred Dose Pack  -Wrote a perscription for Albuterol MDI Inhaler with Spacer. Gave a Persciption for [employer] for Wheezing and SOB via Nebulizer as well as nebulized Albuterol q2hprn for wheezing. -Patient to have Outpatient Appointment with Pulmonology Dr. Sherene Sires on Monday 07/15/2016 at 1:30 pm.   4. Leukocytosis, likely from concomitant Demarginalization from Steroid Use   -Patient's WBC went from 7.7 -> 16.3 -> 24.1 -Patient Afebrile; -D/C'd IV Methylprednisolone 60 mg q6h for wheezing and started 40 mg BID Deltasone; Provided Patient with Sterapred Dose Pack  -Repeat CBC in 1 week after Steroids are done.  5. Hyperglycemia in the setting of IV Steroid  Use and Sepsis -Patient's Glucose was 166 on BMP -CBG ranged from 119-165 -Check HbA1c as an outpatient -Continue to Monitor.   6. Hypophosphetemia, Improved -Patient's Phos level was 3.5 this AM  7. Tobacco Abuse -Smoking Cessation Counseling given. Patient refused Nicotine Replacement at this time and stated he will quit smoking altogether.   8.  Right Eye Blindness -Stable and Chronic. No Active Issues at this time.    Discharge Instructions  Discharge Instructions    Call MD for:  difficulty breathing, headache or visual disturbances    Complete by:  As directed    Diet - low sodium heart healthy    Complete by:  As directed    Discharge instructions    Complete by:  As directed    Take all medications as prescribed. [facility] and with Pulmonary as outpatient. If symptoms worsen or change return to ER for evaluation.   Increase activity slowly    Complete by:  As directed        Medication List    STOP taking these medications   ibuprofen 800 MG tablet Commonly known as:  ADVIL,MOTRIN   methocarbamol 500 MG tablet Commonly known as:  ROBAXIN   predniSONE 20 MG tablet Commonly known as:  DELTASONE Replaced by:  predniSONE 10 MG (21) Tbpk tablet     TAKE these medications   AEROCHAMBER PLUS inhaler Use as instructed   albuterol (2.5 MG/3ML) 0.083% nebulizer solution Commonly known as:  PROVENTIL Take 3 mLs (2.5 mg total) by nebulization every 2 (two) hours as needed for wheezing or shortness of breath.   albuterol 108 (90 Base) MCG/ACT inhaler Commonly known as:  PROVENTIL HFA;VENTOLIN HFA Inhale 2 puffs into the lungs every 6 (six) hours as needed for wheezing or shortness of breath.   guaiFENesin 600 MG 12 hr tablet Commonly known as:  MUCINEX Take 1 tablet (600 mg total) by mouth 2 (two) times daily.   ipratropium-albuterol 0.5-2.5 (3) MG/3ML Soln Commonly known as:  DUONEB Take 3 mLs by nebulization every 6 (six) hours as needed.   penicillin v potassium 500 MG tablet Commonly known as:  VEETID Take 1 tablet (500 mg total) by mouth 2 (two) times daily.   predniSONE 10 MG (21) Tbpk tablet Commonly known as:  STERAPRED UNI-PAK 21 TAB Take 1 tablet (10 mg total) by mouth daily. Replaces:  predniSONE 20 MG tablet      Follow-up Information    Oretha MilchALVA,RAKESH V., MD .    Specialty:  Pulmonary Disease Why:  July 15, 2016 At 1:30 pm to see Dr. Wynelle FannyWert Contact information: [address] [place] [address] [phone]          No Known Allergies  Consultations:  None Inpatient; Referred to Outpatient Pulmonology  Procedures/Studies: Dg Chest 2 View  Result Date: 07/11/2016 CLINICAL DATA:  Wheezing, history of asthma and strep throat and sepsis. Former smoker. EXAM: CHEST  2 VIEW COMPARISON:  PA and lateral chest x-ray of July 08, 2016 FINDINGS: The lungs are adequately inflated. There are coarse lung markings at the lung bases which are stable. The heart and pulmonary vascularity are normal. The mediastinum is normal in width. There is no pleural effusion. The bony thorax exhibits no acute abnormality. IMPRESSION: Stable bibasilar scarring.  No pneumonia nor CHF. Electronically Signed   By: David  SwazilandJordan M.D.   On: 07/11/2016 07:32   Dg Chest 2 View  Result Date: 07/08/2016 CLINICAL DATA:  30 year old male with  asthma and wheezing. EXAM: CHEST  2 VIEW COMPARISON:  Chest radiograph dated 12/05/2015 FINDINGS: The heart size and mediastinal contours are within normal limits. Both lungs are clear. The visualized skeletal structures are unremarkable. IMPRESSION: No active cardiopulmonary disease. Electronically Signed   By: Elgie Collard M.D.   On: 07/08/2016 23:38    Subjective: Patient was seen and examined this AM at bedside and he stated he finally got some sleep last night and rested well. Denied and SOB and stated he felt wheezing had improved. He stated he had a little pain in his chest when he coughed but thought the mucinex was helping him break up his "congestion." When asked about a PCP he stated he had not seen a Physician in over 2 years with the last one being in [place]. No other complaints or concerns and wishes to go home.   Discharge Exam: Vitals:   07/11/16 0600 07/11/16 0922  BP: 134/84 135/81  Pulse: 85 94  Resp: 20 18   Temp: 98.5 F (36.9 C) 97.7 F (36.5 C)   Vitals:   07/11/16 0228 07/11/16 0600 07/11/16 0907 07/11/16 0922  BP:  134/84  135/81  Pulse:  85  94  Resp:  20  18  Temp:  98.5 F (36.9 C)  97.7 F (36.5 C)  TempSrc:  Oral  Oral  SpO2: 98% 98% 97% 96%  Weight:      Height:        General: Pt is alert, awake, not in acute distress Cardiovascular: RRR, S1/S2 +, no rubs, no gallops Respiratory: Moderate Expiratory Wheezing bilaterally; No Crackles appreciated. Patient not tachypenic or using any accessory muscles to breathe.  Abdominal: Soft, NT, ND, bowel sounds+ x4 Extremities: no LE edema, no cyanosis.  The results of significant diagnostics from this hospitalization (including imaging, microbiology, ancillary and laboratory) are listed below for reference.     Microbiology: Recent Results (from the past 240 hour(s))  Rapid strep screen (not at [facility])     Status: Abnormal   Collection Time: 07/09/16  4:59 AM  Result Value Ref Range Status   Streptococcus, Group A Screen (Direct) POSITIVE (A) NEGATIVE Final  Culture, blood (x 2)     Status: None (Preliminary result)   Collection Time: 07/09/16  7:05 AM  Result Value Ref Range Status   Specimen Description BLOOD RIGHT ARM  Final   Special Requests BOTTLES DRAWN AEROBIC AND ANAEROBIC 5CC EACH  Final   Culture NO GROWTH 1 DAY  Final   Report Status PENDING  Incomplete  Culture, blood (x 2)     Status: None (Preliminary result)   Collection Time: 07/09/16  7:20 AM  Result Value Ref Range Status   Specimen Description BLOOD LEFT ARM  Final   Special Requests BOTTLES DRAWN AEROBIC AND ANAEROBIC 5CC EACH  Final   Culture NO GROWTH 1 DAY  Final   Report Status PENDING  Incomplete     Labs: BNP (last 3 results) No results for input(s): BNP in the last 8760 hours. Basic Metabolic Panel:  Recent Labs Lab 07/08/16 2213 07/10/16 0733 07/11/16 0421  NA 137 138 137  K 3.7 3.8 3.6  CL 107 103 105  CO2 23 22 25   GLUCOSE 104*  172* 166*  BUN 16 12 12   CREATININE 1.28* 1.07 0.96  CALCIUM 8.6* 9.3 8.6*  MG  --  2.3 2.0  PHOS  --  2.1* 3.5   Liver Function Tests:  Recent Labs Lab 07/10/16 [phone]  AST 23  ALT 17  ALKPHOS 52  BILITOT 0.5  PROT 7.4  ALBUMIN 3.6   No results for input(s): LIPASE, AMYLASE in the last 168 hours. No results for input(s): AMMONIA in the last 168 hours. CBC:  Recent Labs Lab 07/08/16 2213 07/10/16 0733 07/11/16 0421  WBC 7.7 16.3* 24.1*  NEUTROABS 5.0 13.1* 20.9*  HGB 14.5 15.4 14.7  HCT 43.8 46.0 44.7  MCV 90.3 90.7 91.2  PLT 238 263 262   Cardiac Enzymes: No results for input(s): CKTOTAL, CKMB, CKMBINDEX, TROPONINI in the last 168 hours. BNP: Invalid input(s): POCBNP CBG:  Recent Labs Lab 07/10/16 1158 07/10/16 1611 07/10/16 2139 07/11/16 0646 07/11/16 1138  GLUCAP 161* 165* 164* 178* 119*   D-Dimer No results for input(s): DDIMER in the last 72 hours. Hgb A1c No results for input(s): HGBA1C in the last 72 hours. Lipid Profile No results for input(s): CHOL, HDL, LDLCALC, TRIG, CHOLHDL, LDLDIRECT in the last 72 hours. Thyroid function studies No results for input(s): TSH, T4TOTAL, T3FREE, THYROIDAB in the last 72 hours.  Invalid input(s): FREET3 Anemia work up No results for input(s): VITAMINB12, FOLATE, FERRITIN, TIBC, IRON, RETICCTPCT in the last 72 hours. Urinalysis    Component Value Date/Time   COLORURINE YELLOW 11/20/2007 2250   APPEARANCEUR CLEAR 11/20/2007 2250   LABSPEC 1.037 (H) 11/20/2007 2250   PHURINE 6.0 11/20/2007 2250   GLUCOSEU NEGATIVE 11/20/2007 2250   HGBUR NEGATIVE 11/20/2007 2250   BILIRUBINUR NEGATIVE 11/20/2007 2250   KETONESUR 15 (A) 11/20/2007 2250   PROTEINUR 30 (A) 11/20/2007 2250   UROBILINOGEN 1.0 11/20/2007 2250   NITRITE NEGATIVE 11/20/2007 2250   LEUKOCYTESUR NEGATIVE 11/20/2007 2250   Sepsis Labs Invalid input(s): PROCALCITONIN,  WBC,  LACTICIDVEN Microbiology Recent Results (from the past 240 hour(s))   Rapid strep screen (not at [facility])     Status: Abnormal   Collection Time: 07/09/16  4:59 AM  Result Value Ref Range Status   Streptococcus, Group A Screen (Direct) POSITIVE (A) NEGATIVE Final  Culture, blood (x 2)     Status: None (Preliminary result)   Collection Time: 07/09/16  7:05 AM  Result Value Ref Range Status   Specimen Description BLOOD RIGHT ARM  Final   Special Requests BOTTLES DRAWN AEROBIC AND ANAEROBIC 5CC EACH  Final   Culture NO GROWTH 1 DAY  Final   Report Status PENDING  Incomplete  Culture, blood (x 2)     Status: None (Preliminary result)   Collection Time: 07/09/16  7:20 AM  Result Value Ref Range Status   Specimen Description BLOOD LEFT ARM  Final   Special Requests BOTTLES DRAWN AEROBIC AND ANAEROBIC 5CC EACH  Final   Culture NO GROWTH 1 DAY  Final   Report Status PENDING  Incomplete   Time coordinating discharge: Over 30 minutes  SIGNED:  Merlene Laughter, DO [facility] 07/11/2016, 12:06 PM Pager [phone]  If 7PM-7AM, please contact night-coverage [email or web address] Password [email or web address]

## 2016-07-11 NOTE — Progress Notes (Signed)
Pt discharged at this time with a friend taking all personal belongings. IV discontinued, dry dressing applied. Discharge instructions provided with verbal understanding. Pt will follow up with MD. Pt received breathing treatment prior to discharge. Pt denied pain or discomfort.

## 2016-07-14 LAB — CULTURE, BLOOD (ROUTINE X 2)
CULTURE: NO GROWTH
Culture: NO GROWTH

## 2016-07-15 ENCOUNTER — Inpatient Hospital Stay: Payer: Self-pay | Admitting: Internal Medicine

## 2016-08-27 ENCOUNTER — Encounter (HOSPITAL_COMMUNITY): Payer: Self-pay | Admitting: Emergency Medicine

## 2016-08-27 ENCOUNTER — Emergency Department (HOSPITAL_COMMUNITY)
Admission: EM | Admit: 2016-08-27 | Discharge: 2016-08-27 | Disposition: A | Discharge status: Home / Self Care | Payer: Self-pay | Attending: Emergency Medicine | Admitting: Emergency Medicine

## 2016-08-27 ENCOUNTER — Emergency Department (HOSPITAL_COMMUNITY): Payer: Self-pay

## 2016-08-27 DIAGNOSIS — J4521 Mild intermittent asthma with (acute) exacerbation: Secondary | ICD-10-CM | POA: Insufficient documentation

## 2016-08-27 DIAGNOSIS — Z87891 Personal history of nicotine dependence: Secondary | ICD-10-CM | POA: Insufficient documentation

## 2016-08-27 LAB — BASIC METABOLIC PANEL
ANION GAP: 7 (ref 5–15)
BUN: 11 mg/dL (ref 6–20)
CHLORIDE: 107 mmol/L (ref 101–111)
CO2: 27 mmol/L (ref 22–32)
Calcium: 9.1 mg/dL (ref 8.9–10.3)
Creatinine, Ser: 1 mg/dL (ref 0.61–1.24)
GFR calc non Af Amer: >60 mL/min (ref >60)
GLUCOSE: 109 mg/dL — AB (ref 65–99)
Potassium: 3.8 mmol/L (ref 3.5–5.1)
Sodium: 141 mmol/L (ref 135–145)

## 2016-08-27 LAB — CBC WITH DIFFERENTIAL/PLATELET
BASOS ABS: 0.1 10*3/uL (ref 0.0–0.1)
Basophils Relative: 1 %
EOS PCT: 10 %
Eosinophils Absolute: 1 10*3/uL — ABNORMAL HIGH (ref 0.0–0.7)
HEMATOCRIT: 45.6 % (ref 39.0–52.0)
HEMOGLOBIN: 15.1 g/dL (ref 13.0–17.0)
LYMPHS PCT: 59 %
Lymphs Abs: 6 10*3/uL — ABNORMAL HIGH (ref 0.7–4.0)
MCH: 29.8 pg (ref 26.0–34.0)
MCHC: 33.1 g/dL (ref 30.0–36.0)
MCV: 89.9 fL (ref 78.0–100.0)
MONOS PCT: 9 %
Monocytes Absolute: 0.9 10*3/uL (ref 0.1–1.0)
NEUTROS ABS: 2.1 10*3/uL (ref 1.7–7.7)
Neutrophils Relative %: 21 %
Platelets: 248 10*3/uL (ref 150–400)
RBC: 5.07 MIL/uL (ref 4.22–5.81)
RDW: 12.4 % (ref 11.5–15.5)
WBC: 10.1 10*3/uL (ref 4.0–10.5)

## 2016-08-27 LAB — PATHOLOGIST SMEAR REVIEW: Path Review: REACTIVE

## 2016-08-27 MED ORDER — PREDNISONE 20 MG PO TABS: 60.0000 mg | ORAL_TABLET | Freq: Every day | ORAL | 0 refills | Status: DC

## 2016-08-27 MED ORDER — ALBUTEROL SULFATE HFA 108 (90 BASE) MCG/ACT IN AERS
2.0000 | INHALATION_SPRAY | RESPIRATORY_TRACT | Status: DC | PRN
  Administered 2016-08-27: 2 via RESPIRATORY_TRACT
  Filled 2016-08-27: qty 6.7

## 2016-08-27 MED ORDER — ALBUTEROL (5 MG/ML) CONTINUOUS INHALATION SOLN
10.0000 mg/h | INHALATION_SOLUTION | Freq: Once | RESPIRATORY_TRACT | Status: AC
  Administered 2016-08-27: 10 mg/h via RESPIRATORY_TRACT
  Filled 2016-08-27: qty 20

## 2016-08-27 NOTE — ED Notes (Signed)
Pt has been given discharge instructions and is dressed. Pt using phone to call family for a ride.

## 2016-08-27 NOTE — ED Triage Notes (Signed)
Patient reports worsening asthma for 2 days unrelieved by home nebulizer treatment with wheezing /productive cough , received Duoneb treatment by EMS , Solumedrol IV 125mg  , Magnesium 2 grams IV and Zofran 4 mg IV by EMS prior to arrival .

## 2016-08-27 NOTE — ED Notes (Signed)
Pt awaken from sleeping and given a phone to call his brother to pick him up. Pt states she feels much better, pt has easily breathing with no wheezing.

## 2016-08-27 NOTE — ED Provider Notes (Signed)
[facility]-EMERGENCY DEPT Provider Note   CSN: [id number] Arrival date & time: 08/27/16  [id number]     History   Chief Complaint Chief Complaint  Patient presents with  . Asthma    HPI Ian Terry is a 30 y.o. male.  Patient presents to the emergency department for evaluation of cough and shortness of breath. Patient reports a history of asthma. He has been having increased shortness of breath and wheezing for the last 2 days. Breathing worsened tonight. He is brought to the ER by ambulance from home. Patient received a DuoNeb, Solu-Medrol 125 mg IV and magnesium 2 mg IV during transport. He has been experiencing nausea and vomiting with the symptoms since transport. He was given Zofran for the nausea. Patient reports minimal improvement with treatment.      Past Medical History:  Diagnosis Date  . Asthma   . Legal blindness    right eye only     Patient Active Problem List   Diagnosis Date Noted  . Asthma exacerbation 07/09/2016  . Sepsis (HCC) 07/09/2016  . Streptococcal sore throat 07/09/2016  . PNEUMONIA, RIGHT LOWER LOBE 02/01/2008  . ASTHMA 02/01/2008    Past Surgical History:  Procedure Laterality Date  . EYE SURGERY         Home Medications    Prior to Admission medications   Medication Sig Start Date End Date Taking? Authorizing Provider  albuterol (PROVENTIL HFA;VENTOLIN HFA) 108 (90 Base) MCG/ACT inhaler Inhale 2 puffs into the lungs every 6 (six) hours as needed for wheezing or shortness of breath. 07/11/16   Merlene Laughtermair Latif Sheikh, DO  albuterol (PROVENTIL) (2.5 MG/3ML) 0.083% nebulizer solution Take 3 mLs (2.5 mg total) by nebulization every 2 (two) hours as needed for wheezing or shortness of breath. 07/11/16   Omair Latif Sheikh, DO  guaiFENesin (MUCINEX) 600 MG 12 hr tablet Take 1 tablet (600 mg total) by mouth 2 (two) times daily. 07/11/16   Omair Latif Sheikh, DO  ipratropium-albuterol (DUONEB) 0.5-2.5 (3) MG/3ML SOLN Take 3 mLs by nebulization every 6 (six)  hours as needed. 07/11/16   Merlene Laughtermair Latif Sheikh, DO  penicillin v potassium (VEETID) 500 MG tablet Take 1 tablet (500 mg total) by mouth 2 (two) times daily. 07/11/16   Merlene Laughtermair Latif Sheikh, DO  predniSONE (DELTASONE) 20 MG tablet Take 3 tablets (60 mg total) by mouth daily with breakfast. 08/27/16   Gilda Creasehristopher J Brady Plant, MD  Spacer/Aero-Holding Chambers (AEROCHAMBER PLUS) inhaler Use as instructed 07/11/16   Merlene Laughtermair Latif Sheikh, DO    Family History No family history on file.  Social History Social History  Substance Use Topics  . Smoking status: Former Smoker    Quit date: 07/02/2016  . Smokeless tobacco: Never Used  . Alcohol use No     Allergies   Patient has no known allergies.   Review of Systems Review of Systems  Respiratory: Positive for cough and shortness of breath.   All other systems reviewed and are negative.    Physical Exam Updated Vital Signs BP 121/68   Pulse 99   Temp 98 F (36.7 C) (Axillary)   Resp 19   SpO2 95%   Physical Exam  Constitutional: He is oriented to person, place, and time. He appears well-developed and well-nourished. No distress.  HENT:  Head: Normocephalic and atraumatic.  Right Ear: Hearing normal.  Left Ear: Hearing normal.  Nose: Nose normal.  Mouth/Throat: Oropharynx is clear and moist and mucous membranes are normal.  Eyes: Conjunctivae and EOM  are normal. Pupils are equal, round, and reactive to light.  Neck: Normal range of motion. Neck supple.  Cardiovascular: Regular rhythm, S1 normal and S2 normal.  Exam reveals no gallop and no friction rub.   No murmur heard. Pulmonary/Chest: Accessory muscle usage present. Tachypnea noted. No respiratory distress. He has decreased breath sounds. He has wheezes. He exhibits no tenderness.  Abdominal: Soft. Normal appearance and bowel sounds are normal. There is no hepatosplenomegaly. There is no tenderness. There is no rebound, no guarding, no tenderness at McBurney's point and negative  Murphy's sign. No hernia.  Musculoskeletal: Normal range of motion.  Neurological: He is alert and oriented to person, place, and time. He has normal strength. No cranial nerve deficit or sensory deficit. Coordination normal. GCS eye subscore is 4. GCS verbal subscore is 5. GCS motor subscore is 6.  Skin: Skin is warm, dry and intact. No rash noted. No cyanosis.  Psychiatric: He has a normal mood and affect. His speech is normal and behavior is normal. Thought content normal.  Nursing note and vitals reviewed.    ED Treatments / Results  Labs (all labs ordered are listed, but only abnormal results are displayed) Labs Reviewed  CBC WITH DIFFERENTIAL/PLATELET - Abnormal; Notable for the following:       Result Value   Lymphs Abs 6.0 (*)    Eosinophils Absolute 1.0 (*)    All other components within normal limits  BASIC METABOLIC PANEL - Abnormal; Notable for the following:    Glucose, Bld 109 (*)    All other components within normal limits  PATHOLOGIST SMEAR REVIEW    EKG  EKG Interpretation None       Radiology Dg Chest Port 1 View  Result Date: 08/27/2016 CLINICAL DATA:  Initial evaluation for acute shortness of breath. History of asthma. EXAM: PORTABLE CHEST 1 VIEW COMPARISON:  Prior radiograph from 07/11/2016. FINDINGS: The cardiac and mediastinal silhouettes are stable in size and contour, and remain within normal limits. The lungs are normally inflated. No airspace consolidation, pleural effusion, or pulmonary edema is identified. There is no pneumothorax. Mild central airway thickening. No acute osseous abnormality identified. IMPRESSION: Mild central airway thickening, like related to history of asthma. No other active cardiopulmonary disease. Electronically Signed   By: Rise MuBenjamin  McClintock M.D.   On: 08/27/2016 05:18    Procedures Procedures (including critical care time)  Medications Ordered in ED Medications  albuterol (PROVENTIL,VENTOLIN) solution continuous neb  (10 mg/hr Nebulization Given 08/27/16 0453)     Initial Impression / Assessment and Plan / ED Course  I have reviewed the triage vital signs and the nursing notes.  Pertinent labs & imaging results that were available during my care of the patient were reviewed by me and considered in my medical decision making (see chart for details).  Clinical Course    Patient presents with complaints of shortness of breath. Patient has a history of asthma, does not have an inhaler. Patient brought to the ER by EMS. Patient has been administered albuterol, Atrovent, Solu-Medrol and magnesium during transport. At arrival to the ER patient had a 10 mg continuous nebulizer treatment of albuterol. He has been monitored and now is much improved. Has minimal wheezing and good air movement. Oxygen saturations are normal. Chest x-ray clear.  Final Clinical Impressions(s) / ED Diagnoses   Final diagnoses:  Mild intermittent asthma with exacerbation    New Prescriptions New Prescriptions   PREDNISONE (DELTASONE) 20 MG TABLET    Take 3 tablets (  60 mg total) by mouth daily with breakfast.     Gilda Crease, MD 08/27/16 [phone]

## 2016-09-21 ENCOUNTER — Emergency Department (HOSPITAL_COMMUNITY)
Admission: EM | Admit: 2016-09-21 | Discharge: 2016-09-21 | Disposition: A | Discharge status: Home / Self Care | Payer: Self-pay | Attending: Emergency Medicine | Admitting: Emergency Medicine

## 2016-09-21 ENCOUNTER — Encounter (HOSPITAL_COMMUNITY): Payer: Self-pay | Admitting: Emergency Medicine

## 2016-09-21 ENCOUNTER — Emergency Department (HOSPITAL_COMMUNITY): Payer: Self-pay

## 2016-09-21 DIAGNOSIS — Z87891 Personal history of nicotine dependence: Secondary | ICD-10-CM | POA: Insufficient documentation

## 2016-09-21 DIAGNOSIS — J45901 Unspecified asthma with (acute) exacerbation: Secondary | ICD-10-CM | POA: Insufficient documentation

## 2016-09-21 LAB — BASIC METABOLIC PANEL
ANION GAP: 6 (ref 5–15)
BUN: 13 mg/dL (ref 6–20)
CHLORIDE: 110 mmol/L (ref 101–111)
CO2: 25 mmol/L (ref 22–32)
Calcium: 8.9 mg/dL (ref 8.9–10.3)
Creatinine, Ser: 1.02 mg/dL (ref 0.61–1.24)
GFR calc non Af Amer: >60 mL/min (ref >60)
Glucose, Bld: 96 mg/dL (ref 65–99)
POTASSIUM: 3.4 mmol/L — AB (ref 3.5–5.1)
SODIUM: 141 mmol/L (ref 135–145)

## 2016-09-21 LAB — CBC
HCT: 42.7 % (ref 39.0–52.0)
HEMOGLOBIN: 14.4 g/dL (ref 13.0–17.0)
MCH: 29.9 pg (ref 26.0–34.0)
MCHC: 33.7 g/dL (ref 30.0–36.0)
MCV: 88.6 fL (ref 78.0–100.0)
Platelets: 300 10*3/uL (ref 150–400)
RBC: 4.82 MIL/uL (ref 4.22–5.81)
RDW: 12.3 % (ref 11.5–15.5)
WBC: 9.1 10*3/uL (ref 4.0–10.5)

## 2016-09-21 LAB — I-STAT TROPONIN, ED: Troponin i, poc: 0 ng/mL (ref 0.00–0.08)

## 2016-09-21 MED ORDER — PREDNISONE 20 MG PO TABS
60.0000 mg | ORAL_TABLET | Freq: Once | ORAL | Status: AC
  Administered 2016-09-21: 60 mg via ORAL
  Filled 2016-09-21: qty 3

## 2016-09-21 MED ORDER — PREDNISONE 50 MG PO TABS: 50.0000 mg | ORAL_TABLET | Freq: Every day | ORAL | 0 refills | Status: DC

## 2016-09-21 MED ORDER — ALBUTEROL SULFATE (2.5 MG/3ML) 0.083% IN NEBU
INHALATION_SOLUTION | RESPIRATORY_TRACT | Status: AC
  Administered 2016-09-21: 5 mg via RESPIRATORY_TRACT
  Filled 2016-09-21: qty 6

## 2016-09-21 MED ORDER — POTASSIUM CHLORIDE CRYS ER 20 MEQ PO TBCR
40.0000 meq | EXTENDED_RELEASE_TABLET | Freq: Once | ORAL | Status: AC
  Administered 2016-09-21: 40 meq via ORAL
  Filled 2016-09-21: qty 2

## 2016-09-21 MED ORDER — IPRATROPIUM BROMIDE 0.02 % IN SOLN
0.5000 mg | Freq: Once | RESPIRATORY_TRACT | Status: AC
  Administered 2016-09-21: 0.5 mg via RESPIRATORY_TRACT
  Filled 2016-09-21: qty 2.5

## 2016-09-21 MED ORDER — ALBUTEROL SULFATE (2.5 MG/3ML) 0.083% IN NEBU: 2.5000 mg | INHALATION_SOLUTION | RESPIRATORY_TRACT | 12 refills | Status: DC | PRN

## 2016-09-21 MED ORDER — ALBUTEROL SULFATE (2.5 MG/3ML) 0.083% IN NEBU
5.0000 mg | INHALATION_SOLUTION | Freq: Once | RESPIRATORY_TRACT | Status: AC
  Administered 2016-09-21: 5 mg via RESPIRATORY_TRACT
  Filled 2016-09-21: qty 6

## 2016-09-21 MED ORDER — ALBUTEROL SULFATE (2.5 MG/3ML) 0.083% IN NEBU
5.0000 mg | INHALATION_SOLUTION | Freq: Once | RESPIRATORY_TRACT | Status: AC
  Administered 2016-09-21: 5 mg via RESPIRATORY_TRACT

## 2016-09-21 MED ORDER — ALBUTEROL SULFATE HFA 108 (90 BASE) MCG/ACT IN AERS: 2.0000 | INHALATION_SPRAY | Freq: Four times a day (QID) | RESPIRATORY_TRACT | 2 refills | Status: DC | PRN

## 2016-09-21 NOTE — Discharge Instructions (Signed)
Return to the ED with any concerns including difficulty breathing despite using albuterol every 4 hours, not drinking fluids, decreased urine output, vomiting and not able to keep down liquids or medications, decreased level of alertness/lethargy, or any other alarming symptoms °

## 2016-09-21 NOTE — ED Notes (Signed)
Dr Linker in room 

## 2016-09-21 NOTE — ED Provider Notes (Signed)
[facility]-EMERGENCY DEPT Provider Note   CSN: [id number] Arrival date & time: 09/21/16  2007     History   Chief Complaint Chief Complaint  Patient presents with  . Chest Pain  . Asthma    HPI Ian Terry is a 30 y.o. male.  HPI  Pt presenting with c/o shortness of breath, cough and wheezing.  Pt states he started feeling short of breath yesterday.  Has been using his albuterol frequently- the albuterol was only helping for appox 10 minutes.  Chest feels tight similar to prior asthma exacerbations.  Pt last used steroids approx 1 month ago.  Symptoms had resolved until yesterday.  No fever/chills.  There are no other associated systemic symptoms, there are no other alleviating or modifying factors.   Past Medical History:  Diagnosis Date  . Asthma   . Legal blindness    right eye only     Patient Active Problem List   Diagnosis Date Noted  . Asthma exacerbation 07/09/2016  . Sepsis (HCC) 07/09/2016  . Streptococcal sore throat 07/09/2016  . PNEUMONIA, RIGHT LOWER LOBE 02/01/2008  . ASTHMA 02/01/2008    Past Surgical History:  Procedure Laterality Date  . EYE SURGERY         Home Medications    Prior to Admission medications   Medication Sig Start Date End Date Taking? Authorizing Provider  ipratropium-albuterol (DUONEB) 0.5-2.5 (3) MG/3ML SOLN Take 3 mLs by nebulization every 6 (six) hours as needed. Patient taking differently: Take 3 mLs by nebulization every 6 (six) hours as needed (for wheezing or shortness of breath).  07/11/16  Yes Omair Latif Sheikh, DO  Spacer/Aero-Holding Chambers (AEROCHAMBER PLUS) inhaler Use as instructed 07/11/16  Yes Merlene Laughtermair Latif Sheikh, DO  albuterol (PROVENTIL HFA;VENTOLIN HFA) 108 (90 Base) MCG/ACT inhaler Inhale 2 puffs into the lungs every 6 (six) hours as needed for wheezing or shortness of breath. 09/21/16   Jerelyn ScottMartha Linker, MD  albuterol (PROVENTIL) (2.5 MG/3ML) 0.083% nebulizer solution Take 3 mLs (2.5 mg total) by nebulization every  2 (two) hours as needed for wheezing or shortness of breath. 09/21/16   Jerelyn ScottMartha Linker, MD  guaiFENesin (MUCINEX) 600 MG 12 hr tablet Take 1 tablet (600 mg total) by mouth 2 (two) times daily. Patient not taking: Reported on 09/21/2016 07/11/16   Merlene Laughtermair Latif Sheikh, DO  penicillin v potassium (VEETID) 500 MG tablet Take 1 tablet (500 mg total) by mouth 2 (two) times daily. Patient not taking: Reported on 09/21/2016 07/11/16   Merlene Laughtermair Latif Sheikh, DO  predniSONE (DELTASONE) 50 MG tablet Take 1 tablet (50 mg total) by mouth daily. 09/21/16   Jerelyn ScottMartha Linker, MD    Family History No family history on file.  Social History Social History  Substance Use Topics  . Smoking status: Former Smoker    Quit date: 07/02/2016  . Smokeless tobacco: Never Used  . Alcohol use No     Allergies   Patient has no known allergies.   Review of Systems Review of Systems  ROS reviewed and all otherwise negative except for mentioned in HPI   Physical Exam Updated Vital Signs BP 118/67   Pulse 97   Temp 99.4 F (37.4 C) (Oral)   Resp 17   SpO2 97%  Vitals reviewed Physical Exam Physical Examination: General appearance - alert, well appearing, and in no distress Mental status - alert, oriented to person, place, and time Eyes - no conjunctival injection no scleral icterus Mouth - mucous membranes moist, pharynx normal without lesions  Chest - BSS, mild bilateral wheezing after neb, normal respiratory effort Heart - normal rate, regular rhythm, normal S1, S2, no murmurs, rubs, clicks or gallops Abdomen - soft, nontender, nondistended, no masses or organomegaly Neurological - alert, oriented, normal speech Extremities - peripheral pulses normal, no pedal edema, no clubbing or cyanosis Skin - normal coloration and turgor, no rashes  ED Treatments / Results  Labs (all labs ordered are listed, but only abnormal results are displayed) Labs Reviewed  BASIC METABOLIC PANEL - Abnormal; Notable for the  following:       Result Value   Potassium 3.4 (*)    All other components within normal limits  CBC  I-STAT TROPOININ, ED    EKG  EKG Interpretation  Date/Time:  Saturday September 21 2016 20:15:12 EST Ventricular Rate:  82 PR Interval:  140 QRS Duration: 80 QT Interval:  356 QTC Calculation: 415 R Axis:   60 Text Interpretation:  Normal sinus rhythm with sinus arrhythmia Minimal voltage criteria for LVH, may be normal variant Borderline ECG No significant change since last tracing Confirmed by Karma GanjaLINKER  MD, Jesika Men [phone]([id number]) on 09/21/2016 8:34:48 PM       Radiology Dg Chest 2 View  Result Date: 09/21/2016 CLINICAL DATA:  Dyspnea and congestion for 2 days.  08/27/2016 EXAM: CHEST  2 VIEW COMPARISON:  None. FINDINGS: The heart size and mediastinal contours are within normal limits. Both lungs are clear. The visualized skeletal structures are unremarkable. IMPRESSION: No active cardiopulmonary disease. Electronically Signed   By: Ellery Plunkaniel R Mitchell M.D.   On: 09/21/2016 20:56    Procedures Procedures (including critical care time)  Medications Ordered in ED Medications  albuterol (PROVENTIL) (2.5 MG/3ML) 0.083% nebulizer solution 5 mg (5 mg Nebulization Given 09/21/16 2021)  albuterol (PROVENTIL) (2.5 MG/3ML) 0.083% nebulizer solution 5 mg (5 mg Nebulization Given 09/21/16 2140)  ipratropium (ATROVENT) nebulizer solution 0.5 mg (0.5 mg Nebulization Given 09/21/16 2140)  predniSONE (DELTASONE) tablet 60 mg (60 mg Oral Given 09/21/16 2139)  potassium chloride SA (K-DUR,KLOR-CON) CR tablet 40 mEq (40 mEq Oral Given 09/21/16 2140)     Initial Impression / Assessment and Plan / ED Course  I have reviewed the triage vital signs and the nursing notes.  Pertinent labs & imaging results that were available during my care of the patient were reviewed by me and considered in my medical decision making (see chart for details).  Clinical Course     Pt with hx of asthma presenting with asthma  exacerbation.  He feels improved after 2 nebs in the ED, started on steroids.  Discharged with strict return precautions.  Pt agreeable with plan.  Final Clinical Impressions(s) / ED Diagnoses   Final diagnoses:  Exacerbation of asthma, unspecified asthma severity, unspecified whether persistent    New Prescriptions Discharge Medication List as of 09/21/2016 10:34 PM       Jerelyn ScottMartha Linker, MD 09/22/16 [phone]

## 2016-09-21 NOTE — ED Notes (Signed)
Pt verbalized understanding of d/c instructions and has no further questions. Pt stable and NAD. Pt prescriptions refilled for nebulizer and rescue inhaler.

## 2016-09-21 NOTE — ED Triage Notes (Signed)
Pt. reports central chest pain with SOB , wheezing , productive cough and nausea onset yesterday , denies fever or chills .

## 2016-09-21 NOTE — ED Notes (Signed)
Pt transported to xray 

## 2016-10-01 ENCOUNTER — Inpatient Hospital Stay (HOSPITAL_COMMUNITY)
Admission: EM | Admit: 2016-10-01 | Discharge: 2016-10-03 | DRG: 203 | Disposition: A | Discharge status: Home / Self Care | Payer: Self-pay | Attending: Internal Medicine | Admitting: Internal Medicine

## 2016-10-01 ENCOUNTER — Emergency Department (HOSPITAL_COMMUNITY): Payer: Self-pay

## 2016-10-01 ENCOUNTER — Encounter (HOSPITAL_COMMUNITY): Payer: Self-pay | Admitting: Emergency Medicine

## 2016-10-01 DIAGNOSIS — Z9889 Other specified postprocedural states: Secondary | ICD-10-CM

## 2016-10-01 DIAGNOSIS — H548 Legal blindness, as defined in USA: Secondary | ICD-10-CM | POA: Diagnosis present

## 2016-10-01 DIAGNOSIS — R000 Tachycardia, unspecified: Secondary | ICD-10-CM | POA: Diagnosis present

## 2016-10-01 DIAGNOSIS — H5461 Unqualified visual loss, right eye, normal vision left eye: Secondary | ICD-10-CM | POA: Diagnosis present

## 2016-10-01 DIAGNOSIS — Z87891 Personal history of nicotine dependence: Secondary | ICD-10-CM

## 2016-10-01 DIAGNOSIS — J45901 Unspecified asthma with (acute) exacerbation: Principal | ICD-10-CM | POA: Diagnosis present

## 2016-10-01 LAB — BASIC METABOLIC PANEL
ANION GAP: 10 (ref 5–15)
BUN: 10 mg/dL (ref 6–20)
CALCIUM: 9.2 mg/dL (ref 8.9–10.3)
CO2: 25 mmol/L (ref 22–32)
CREATININE: 1.21 mg/dL (ref 0.61–1.24)
Chloride: 103 mmol/L (ref 101–111)
Glucose, Bld: 105 mg/dL — ABNORMAL HIGH (ref 65–99)
Potassium: 4 mmol/L (ref 3.5–5.1)
SODIUM: 138 mmol/L (ref 135–145)

## 2016-10-01 LAB — CBC
HEMATOCRIT: 45.2 % (ref 39.0–52.0)
HEMOGLOBIN: 15.2 g/dL (ref 13.0–17.0)
MCH: 30.1 pg (ref 26.0–34.0)
MCHC: 33.6 g/dL (ref 30.0–36.0)
MCV: 89.5 fL (ref 78.0–100.0)
Platelets: 311 10*3/uL (ref 150–400)
RBC: 5.05 MIL/uL (ref 4.22–5.81)
RDW: 12.4 % (ref 11.5–15.5)
WBC: 8.1 10*3/uL (ref 4.0–10.5)

## 2016-10-01 LAB — MAGNESIUM: MAGNESIUM: 2.1 mg/dL (ref 1.7–2.4)

## 2016-10-01 LAB — PHOSPHORUS: PHOSPHORUS: 3 mg/dL (ref 2.5–4.6)

## 2016-10-01 MED ORDER — METHYLPREDNISOLONE SODIUM SUCC 125 MG IJ SOLR
INTRAMUSCULAR | Status: AC
  Administered 2016-10-01: 125 mg via INTRAVENOUS
  Filled 2016-10-01: qty 2

## 2016-10-01 MED ORDER — ALBUTEROL SULFATE (2.5 MG/3ML) 0.083% IN NEBU
INHALATION_SOLUTION | RESPIRATORY_TRACT | Status: AC
  Filled 2016-10-01: qty 6

## 2016-10-01 MED ORDER — MAGNESIUM SULFATE 2 GM/50ML IV SOLN
2.0000 g | Freq: Once | INTRAVENOUS | Status: AC
  Administered 2016-10-02: 2 g via INTRAVENOUS
  Filled 2016-10-01: qty 50

## 2016-10-01 MED ORDER — ALBUTEROL (5 MG/ML) CONTINUOUS INHALATION SOLN
2.5000 mg/h | INHALATION_SOLUTION | RESPIRATORY_TRACT | Status: DC
  Administered 2016-10-01: 2.5 mg/h via RESPIRATORY_TRACT
  Filled 2016-10-01: qty 20

## 2016-10-01 MED ORDER — IPRATROPIUM-ALBUTEROL 0.5-2.5 (3) MG/3ML IN SOLN
3.0000 mL | Freq: Once | RESPIRATORY_TRACT | Status: AC
  Administered 2016-10-01: 3 mL via RESPIRATORY_TRACT

## 2016-10-01 MED ORDER — IPRATROPIUM-ALBUTEROL 0.5-2.5 (3) MG/3ML IN SOLN
RESPIRATORY_TRACT | Status: AC
  Filled 2016-10-01: qty 3

## 2016-10-01 MED ORDER — ALBUTEROL SULFATE HFA 108 (90 BASE) MCG/ACT IN AERS
4.0000 | INHALATION_SPRAY | Freq: Once | RESPIRATORY_TRACT | Status: AC
  Administered 2016-10-01: 4 via RESPIRATORY_TRACT
  Filled 2016-10-01: qty 6.7

## 2016-10-01 MED ORDER — METHYLPREDNISOLONE SODIUM SUCC 125 MG IJ SOLR
125.0000 mg | Freq: Once | INTRAMUSCULAR | Status: AC
Start: 2016-10-01 — End: 2016-10-02
  Administered 2016-10-02: 125 mg via INTRAVENOUS
  Filled 2016-10-01: qty 2

## 2016-10-01 NOTE — ED Triage Notes (Signed)
Pt here for SOB and asthma; pt distressed at present

## 2016-10-01 NOTE — ED Notes (Signed)
Patient ambulated in the hallway. Patient's WOB increased significantly but SpO2 increased to 96% from 90%. Patient began wheezing audibly during ambulation. Once patient returned to rest his SpO2 returned into 90%.

## 2016-10-01 NOTE — H&P (Signed)
History and Physical    Ian Terry OZH:[id number] DOB: 08/21/1986 DOA: 10/01/2016  PCP: No PCP Per Patient   Patient coming from: Home.  Chief Complaint: Asthma.  HPI: Ian Terry is a 30 y.o. male with medical history significant of chronic persistent asthma and right eye blindness was coming to the emergency department with progressively worse nonproductive cough, wheezing and dyspnea for several days.  Per patient, since last week he has been having significant difficulty due to his asthma symptoms. He has states that he is sometimes unable to go take his daughter out to the school bus without feeling tired and fatigued. Sometimes after doing this task, he has to immediately take a nebulizer treatment. He states that he was well controlled with the Symbicort inhaler, but he is unable to afford it now. His symptoms have been significant enough that he has 4-5 visits to the ED in the past few weeks. He denies fever, chills, headache, rhinorrhea, earache, sore throat or productive cough.  ED Course: Patient received supplemental oxygen, IV magnesium, IV Solu-Medrol, albuterol and ipratropium via nebulizer.   Review of Systems: As per HPI otherwise 10 point review of systems negative.    Past Medical History:  Diagnosis Date  . Asthma   . Legal blindness    right eye only     Past Surgical History:  Procedure Laterality Date  . EYE SURGERY       reports that he quit smoking about 2 months ago. He has never used smokeless tobacco. He reports that he does not drink alcohol or use drugs.  No Known Allergies  Family History  Problem Relation Age of Onset  . Vitiligo Mother     Prior to Admission medications   Medication Sig Start Date End Date Taking? Authorizing Provider  albuterol (PROVENTIL HFA;VENTOLIN HFA) 108 (90 Base) MCG/ACT inhaler Inhale 2 puffs into the lungs every 6 (six) hours as needed for wheezing or shortness of breath. 09/21/16  Yes Jerelyn Scott, MD    albuterol (PROVENTIL) (2.5 MG/3ML) 0.083% nebulizer solution Take 3 mLs (2.5 mg total) by nebulization every 2 (two) hours as needed for wheezing or shortness of breath. 09/21/16  Yes Jerelyn Scott, MD  ipratropium-albuterol (DUONEB) 0.5-2.5 (3) MG/3ML SOLN Take 3 mLs by nebulization every 6 (six) hours as needed. Patient taking differently: Take 3 mLs by nebulization every 6 (six) hours as needed (for wheezing or shortness of breath).  07/11/16  Yes Omair Latif Sheikh, DO  guaiFENesin (MUCINEX) 600 MG 12 hr tablet Take 1 tablet (600 mg total) by mouth 2 (two) times daily. Patient not taking: Reported on 10/01/2016 07/11/16   Merlene Laughter, DO  penicillin v potassium (VEETID) 500 MG tablet Take 1 tablet (500 mg total) by mouth 2 (two) times daily. Patient not taking: Reported on 10/01/2016 07/11/16   Merlene Laughter, DO  predniSONE (DELTASONE) 50 MG tablet Take 1 tablet (50 mg total) by mouth daily. Patient not taking: Reported on 10/01/2016 09/21/16   Jerelyn Scott, MD  Spacer/Aero-Holding Chambers (AEROCHAMBER PLUS) inhaler Use as instructed Patient not taking: Reported on 10/01/2016 07/11/16   Merlene Laughter, DO    Physical Exam:  Constitutional: NAD, calm, comfortable Vitals:   10/01/16 2245 10/01/16 2300 10/01/16 2315 10/01/16 2330  BP:  121/79 120/71 120/78  Pulse: 116 (!) 122 113 112  Resp: 17 22 20 22   Temp:      TempSrc:      SpO2: 97% 91% (!) 88% 93%  Eyes: PERRL, lids and conjunctivae normal ENMT: Mucous membranes are moist. Posterior pharynx clear of any exudate or lesions.Normal dentition.  Neck: normal, supple, no masses, no thyromegaly Respiratory: Decreased air entry and wheezing bilaterally, no crackles. Normal respiratory effort. No accessory muscle use.  Cardiovascular: Tachycardic at 112 bpm, no murmurs / rubs / gallops. No extremity edema. 2+ pedal pulses. No carotid bruits.  Abdomen: no tenderness, no masses palpated. No hepatosplenomegaly. Bowel sounds  positive.  Musculoskeletal: no clubbing / cyanosis. No joint deformity upper and lower extremities. Good ROM, no contractures. Normal muscle tone.  Skin: no rashes, lesions, ulcers. No induration Neurologic: CN 2-12 grossly intact. Sensation intact, DTR normal. Strength 5/5 in all 4.  Psychiatric: Normal judgment and insight. Alert and oriented x 4. Normal mood.    Labs on Admission: I have personally reviewed following labs and imaging studies  CBC:  Recent Labs Lab 10/01/16 2043  WBC 8.1  HGB 15.2  HCT 45.2  MCV 89.5  PLT 311   Basic Metabolic Panel:  Recent Labs Lab 10/01/16 2043  NA 138  K 4.0  CL 103  CO2 25  GLUCOSE 105*  BUN 10  CREATININE 1.21  CALCIUM 9.2  MG 2.1  PHOS 3.0   GFR: CrCl cannot be calculated (Unknown ideal weight.). Liver Function Tests: No results for input(s): AST, ALT, ALKPHOS, BILITOT, PROT, ALBUMIN in the last 168 hours. No results for input(s): LIPASE, AMYLASE in the last 168 hours. No results for input(s): AMMONIA in the last 168 hours. Coagulation Profile: No results for input(s): INR, PROTIME in the last 168 hours. Cardiac Enzymes: No results for input(s): CKTOTAL, CKMB, CKMBINDEX, TROPONINI in the last 168 hours. BNP (last 3 results) No results for input(s): PROBNP in the last 8760 hours. HbA1C: No results for input(s): HGBA1C in the last 72 hours. CBG: No results for input(s): GLUCAP in the last 168 hours. Lipid Profile: No results for input(s): CHOL, HDL, LDLCALC, TRIG, CHOLHDL, LDLDIRECT in the last 72 hours. Thyroid Function Tests: No results for input(s): TSH, T4TOTAL, FREET4, T3FREE, THYROIDAB in the last 72 hours. Anemia Panel: No results for input(s): VITAMINB12, FOLATE, FERRITIN, TIBC, IRON, RETICCTPCT in the last 72 hours. Urine analysis:    Component Value Date/Time   COLORURINE YELLOW 11/20/2007 2250   APPEARANCEUR CLEAR 11/20/2007 2250   LABSPEC 1.037 (H) 11/20/2007 2250   PHURINE 6.0 11/20/2007 2250    GLUCOSEU NEGATIVE 11/20/2007 2250   HGBUR NEGATIVE 11/20/2007 2250   BILIRUBINUR NEGATIVE 11/20/2007 2250   KETONESUR 15 (A) 11/20/2007 2250   PROTEINUR 30 (A) 11/20/2007 2250   UROBILINOGEN 1.0 11/20/2007 2250   NITRITE NEGATIVE 11/20/2007 2250   LEUKOCYTESUR NEGATIVE 11/20/2007 2250    Radiological Exams on Admission: Dg Chest Port 1 View  Result Date: 10/01/2016 CLINICAL DATA:  Cough and shortness of breath EXAM: PORTABLE CHEST 1 VIEW COMPARISON:  Chest radiograph 09/21/2016 FINDINGS: Cardiomediastinal contours are normal. No pneumothorax or pleural effusion. No focal airspace consolidation or pulmonary edema. IMPRESSION: Clear lungs. Electronically Signed   By: Deatra RobinsonKevin  Herman M.D.   On: 10/01/2016 19:29    EKG: Independently reviewed. Vent. rate 108 BPM PR interval * ms QRS duration 81 ms QT/QTc 337/452 ms P-R-T axes 84 74 70 Sinus tachycardia Right atrial enlargement ST elev, probable normal early repol pattern Artifact in lead(s) II III aVL aVF V4 V5 V6 Since last tracing rate faster  Assessment/Plan Principal Problem:   Asthma exacerbation Admit to telemetry as/observation. Continue supplemental oxygen. Continuous pulse oximetry at  least tonight. Albuterol plus ipratropium nebulizers every 6 hours. Albuterol as needed every 2 hours. Continue Solu-Medrol. He was given magnesium sulfate earlier in the ED.  Active Problems:   Legally blind Supportive care.    DVT prophylaxis: Lovenox SQ. Code Status: Full code. Family Communication:  Disposition Plan: Admit for asthma exacerbation treatment. Consults called:  Admission status: Observation/telemetry.   Bobette Moavid Manuel Sumner Kirchman MD [facility] Pager [phone].  If 7PM-7AM, please contact night-coverage [email or web address] Password [email or web address]  10/01/2016, 11:37 PM

## 2016-10-01 NOTE — ED Provider Notes (Signed)
The patient is a 30 year old male, history of asthma, states that he is treated for frequent asthma exacerbations in the past and according to the medical record he hasn't fact that her on a monthly basis for reactive airway disease type exacerbations. He presents with significant increased work of breathing, tachycardia, tachypnea, wheezing in all lung fields with prolonged expiratory phase. He is in respiratory distress, he is able to speak in shortened sentences, he has no edema or JVD. The patient will need further evaluation for the source of this as he states that he never feels good but acutely within the last hour became significantly worse and was not getting relief with his home albuterol. Continue his nebulizer and steroids, reevaluate.  After nebs, the pt was having ongoing SOB and wheezing and hypoxia  Needed admission for ongoing Wheezing / SOB / Hypoxia  I saw and evaluated the patient, reviewed the resident's note and I agree with the findings and plan.   EKG Interpretation  Date/Time:  Tuesday October 01 2016 19:00:32 EST Ventricular Rate:  108 PR Interval:    QRS Duration: 81 QT Interval:  337 QTC Calculation: 452 R Axis:   74 Text Interpretation:  Sinus tachycardia Right atrial enlargement ST elev, probable normal early repol pattern Artifact in lead(s) II III aVL aVF V4 V5 V6 Since last tracing rate faster Confirmed by Areeb Corron  MD, Reine Bristow ([id number]) on 10/01/2016 7:07:12 PM         Final diagnoses:  Moderate asthma with exacerbation, unspecified whether persistent      Eber HongBrian Briggett Tuccillo, MD 10/02/16 1037

## 2016-10-01 NOTE — ED Provider Notes (Signed)
[facility]Y DEPT Provider Note   CSN: [id number] Arrival date & time: 10/01/16  1840     History   Chief Complaint Chief Complaint  Patient presents with  . Asthma    HPI Ian Terry is a 30 y.o. male.  The history is provided by the patient and medical records. No language interpreter was used.  Asthma  This is a new problem. The current episode started less than 1 hour ago. The problem has been gradually worsening. Associated symptoms include chest pain and shortness of breath. Pertinent negatives include no abdominal pain and no headaches.     This is a 30-year-old male with past medical history of asthma, blindness in right eye he presents today with shortness of breath that started approximately 1 hour prior to arrival. Patient states that he has had similar episodes in the past and it is usually secondary to his asthma. States he is trying albuterol at home with no improvement in his symptoms. Denies any recent fevers, chills, nausea, vomiting, diarrhea. Endorses tightness in his chest. Has been seen here several times in the last few months for asthma exacerbations. Has been admitted this year for asthma exacerbation with acute infection. He was given duo nebs in triage and patient states that this is not improved his symptoms. The patient arrives by private vehicle.  Past Medical History:  Diagnosis Date  . Asthma   . Legal blindness    right eye only     Patient Active Problem List   Diagnosis Date Noted  . Asthma exacerbation 07/09/2016  . Sepsis (HCC) 07/09/2016  . Streptococcal sore throat 07/09/2016  . PNEUMONIA, RIGHT LOWER LOBE 02/01/2008  . ASTHMA 02/01/2008    Past Surgical History:  Procedure Laterality Date  . EYE SURGERY         Home Medications    Prior to Admission medications   Medication Sig Start Date End Date Taking? Authorizing Provider  albuterol (PROVENTIL HFA;VENTOLIN HFA) 108 (90 Base) MCG/ACT inhaler Inhale 2 puffs into the  lungs every 6 (six) hours as needed for wheezing or shortness of breath. 09/21/16  Yes Jerelyn ScottMartha Linker, MD  albuterol (PROVENTIL) (2.5 MG/3ML) 0.083% nebulizer solution Take 3 mLs (2.5 mg total) by nebulization every 2 (two) hours as needed for wheezing or shortness of breath. 09/21/16  Yes Jerelyn ScottMartha Linker, MD  ipratropium-albuterol (DUONEB) 0.5-2.5 (3) MG/3ML SOLN Take 3 mLs by nebulization every 6 (six) hours as needed. Patient taking differently: Take 3 mLs by nebulization every 6 (six) hours as needed (for wheezing or shortness of breath).  07/11/16  Yes Omair Latif Sheikh, DO  guaiFENesin (MUCINEX) 600 MG 12 hr tablet Take 1 tablet (600 mg total) by mouth 2 (two) times daily. Patient not taking: Reported on 10/01/2016 07/11/16   Merlene Laughtermair Latif Sheikh, DO  penicillin v potassium (VEETID) 500 MG tablet Take 1 tablet (500 mg total) by mouth 2 (two) times daily. Patient not taking: Reported on 10/01/2016 07/11/16   Merlene Laughtermair Latif Sheikh, DO  predniSONE (DELTASONE) 50 MG tablet Take 1 tablet (50 mg total) by mouth daily. Patient not taking: Reported on 10/01/2016 09/21/16   Jerelyn ScottMartha Linker, MD  Spacer/Aero-Holding Chambers (AEROCHAMBER PLUS) inhaler Use as instructed Patient not taking: Reported on 10/01/2016 07/11/16   Merlene Laughtermair Latif Sheikh, DO    Family History History reviewed. No pertinent family history.  Social History Social History  Substance Use Topics  . Smoking status: Former Smoker    Quit date: 07/02/2016  . Smokeless tobacco: Never Used  .  Alcohol use No     Allergies   Patient has no known allergies.   Review of Systems Review of Systems  Constitutional: Negative for chills and fever.  Respiratory: Positive for cough, chest tightness, shortness of breath and wheezing.   Cardiovascular: Positive for chest pain.  Gastrointestinal: Negative for abdominal pain.  Neurological: Negative for headaches.  Psychiatric/Behavioral: Negative for agitation and confusion.  All other systems reviewed  and are negative.    Physical Exam Updated Vital Signs BP 119/74   Pulse 116   Temp 98.7 F (37.1 C) (Oral)   Resp 17   SpO2 97%   Physical Exam  Constitutional: He is oriented to person, place, and time. He appears well-developed and well-nourished. He appears distressed.  HENT:  Head: Normocephalic and atraumatic.  Eyes: Conjunctivae and EOM are normal.  Neck: Normal range of motion. Neck supple.  Pulmonary/Chest: He is in respiratory distress. He has wheezes (diffuse exp wheezing). He has no rales.  Abdominal: Soft. He exhibits no distension. There is no tenderness.  Musculoskeletal: Normal range of motion. He exhibits no edema.  Neurological: He is alert and oriented to person, place, and time.  Skin: He is diaphoretic.  Psychiatric: His mood appears anxious.  Nursing note and vitals reviewed.    ED Treatments / Results  Labs (all labs ordered are listed, but only abnormal results are displayed) Labs Reviewed  BASIC METABOLIC PANEL - Abnormal; Notable for the following:       Result Value   Glucose, Bld 105 (*)    All other components within normal limits  CBC  MAGNESIUM  PHOSPHORUS    EKG  EKG Interpretation  Date/Time:  Tuesday October 01 2016 19:00:32 EST Ventricular Rate:  108 PR Interval:    QRS Duration: 81 QT Interval:  337 QTC Calculation: 452 R Axis:   74 Text Interpretation:  Sinus tachycardia Right atrial enlargement ST elev, probable normal early repol pattern Artifact in lead(s) II III aVL aVF V4 V5 V6 Since last tracing rate faster Confirmed by MILLER  MD, BRIAN ([id number]) on 10/01/2016 7:07:12 PM       Radiology Dg Chest Port 1 View  Result Date: 10/01/2016 CLINICAL DATA:  Cough and shortness of breath EXAM: PORTABLE CHEST 1 VIEW COMPARISON:  Chest radiograph 09/21/2016 FINDINGS: Cardiomediastinal contours are normal. No pneumothorax or pleural effusion. No focal airspace consolidation or pulmonary edema. IMPRESSION: Clear lungs.  Electronically Signed   By: Deatra RobinsonKevin  Herman M.D.   On: 10/01/2016 19:29    Procedures Procedures (including critical care time)  Medications Ordered in ED Medications  albuterol (PROVENTIL,VENTOLIN) solution continuous neb (2.5 mg/hr Nebulization New Bag/Given 10/01/16 1923)  methylPREDNISolone sodium succinate (SOLU-MEDROL) 125 mg/2 mL injection 125 mg (not administered)  magnesium sulfate IVPB 2 g 50 mL (not administered)  ipratropium-albuterol (DUONEB) 0.5-2.5 (3) MG/3ML nebulizer solution 3 mL (3 mLs Nebulization Given 10/01/16 1847)  methylPREDNISolone sodium succinate (SOLU-MEDROL) 125 mg/2 mL injection (125 mg  Given 10/01/16 1910)  albuterol (PROVENTIL HFA;VENTOLIN HFA) 108 (90 Base) MCG/ACT inhaler 4 puff (4 puffs Inhalation Given 10/01/16 2204)     Initial Impression / Assessment and Plan / ED Course  I have reviewed the triage vital signs and the nursing notes.  Pertinent labs & imaging results that were available during my care of the patient were reviewed by me and considered in my medical decision making (see chart for details).  Clinical Course     Patient is a 30 year old male with a past medical history  of asthma who presents today with shortness of breath that started approximately 1 hour prior to arrival. Patient has diffuse extremity wheezes on my exam. He is oxygenating well but is very cachectic. He is given a dose of DuoNeb nebs in triage. States he feels slightly improved but not much. We continued with another dose of DuoNeb nebs and then continuous Drop. Patient also received Solu-Medrol. He did begin to breathe more comfortably however on ambulation the patient had desaturation and worsening tachypnea. At this point we discussed with the hospitalist who is agreeable to admit the patient. The patient was stable the time of admission to the hospitalist service.  Final Clinical Impressions(s) / ED Diagnoses   Final diagnoses:  Moderate asthma with exacerbation,  unspecified whether persistent    New Prescriptions New Prescriptions   No medications on file     Madolyn Frieze, MD 10/01/16 1610    Eber Hong, MD 10/02/16 1037

## 2016-10-02 ENCOUNTER — Encounter (HOSPITAL_COMMUNITY): Payer: Self-pay | Admitting: General Practice

## 2016-10-02 DIAGNOSIS — J4521 Mild intermittent asthma with (acute) exacerbation: Secondary | ICD-10-CM

## 2016-10-02 DIAGNOSIS — J45901 Unspecified asthma with (acute) exacerbation: Principal | ICD-10-CM

## 2016-10-02 LAB — D-DIMER, QUANTITATIVE: D-Dimer, Quant: 0.29 ug/mL-FEU (ref 0.00–0.50)

## 2016-10-02 MED ORDER — ONDANSETRON HCL 4 MG PO TABS: 4.0000 mg | ORAL_TABLET | Freq: Four times a day (QID) | ORAL | Status: DC | PRN

## 2016-10-02 MED ORDER — IPRATROPIUM-ALBUTEROL 0.5-2.5 (3) MG/3ML IN SOLN
3.0000 mL | Freq: Four times a day (QID) | RESPIRATORY_TRACT | Status: DC
  Administered 2016-10-02 – 2016-10-03 (×6): 3 mL via RESPIRATORY_TRACT
  Filled 2016-10-02 (×6): qty 3

## 2016-10-02 MED ORDER — ALBUTEROL SULFATE (2.5 MG/3ML) 0.083% IN NEBU
2.5000 mg | INHALATION_SOLUTION | RESPIRATORY_TRACT | Status: DC | PRN
  Administered 2016-10-02 (×2): 2.5 mg via RESPIRATORY_TRACT
  Filled 2016-10-02 (×2): qty 3

## 2016-10-02 MED ORDER — ONDANSETRON HCL 4 MG/2ML IJ SOLN: 4.0000 mg | Freq: Four times a day (QID) | INTRAMUSCULAR | Status: DC | PRN

## 2016-10-02 MED ORDER — ENOXAPARIN SODIUM 40 MG/0.4ML ~~LOC~~ SOLN
40.0000 mg | SUBCUTANEOUS | Status: DC
  Administered 2016-10-02: 40 mg via SUBCUTANEOUS
  Filled 2016-10-02: qty 0.4

## 2016-10-02 MED ORDER — SODIUM CHLORIDE 0.9% FLUSH
3.0000 mL | Freq: Two times a day (BID) | INTRAVENOUS | Status: DC
  Administered 2016-10-02 (×2): 3 mL via INTRAVENOUS

## 2016-10-02 MED ORDER — METHYLPREDNISOLONE SODIUM SUCC 125 MG IJ SOLR
80.0000 mg | Freq: Four times a day (QID) | INTRAMUSCULAR | Status: DC
  Administered 2016-10-01: 125 mg via INTRAVENOUS
  Administered 2016-10-02 – 2016-10-03 (×5): 80 mg via INTRAVENOUS
  Filled 2016-10-02 (×6): qty 2

## 2016-10-02 MED ORDER — POTASSIUM CHLORIDE IN NACL 20-0.9 MEQ/L-% IV SOLN
INTRAVENOUS | Status: DC
  Administered 2016-10-02: 01:00:00 via INTRAVENOUS
  Filled 2016-10-02: qty 1000

## 2016-10-02 NOTE — Progress Notes (Addendum)
[facility] PROGRESS NOTE  Ian MacKaron L Dunnavant ZOX:[id number] DOB: 10-05-1986 DOA: 10/01/2016   PCP: No PCP Per Patient     Assessment/Plan: Principal Problem:   Asthma exacerbation Active Problems:   Legally blind   Moderate asthma with exacerbation    Ian Terry is a 30 y.o. male with medical history significant of chronic persistent asthma and right eye blindness was coming to the emergency department with progressively worse nonproductive cough, wheezing and dyspnea for several days.  Assessment/Plan Principal Problem:   Asthma exacerbation Discontinue telemetry if d-dimer is negative Continue supplemental oxygen. Currently on 2 L Albuterol plus ipratropium nebulizers every 6 hours. Albuterol as needed every 2 hours. Continue Solu-Medrol. No pneumonia on chest x-ray      Legally blind Supportive care    DVT prophylaxsis lovenox   Code Status:  Full code    Family Communication: Discussed in detail with the patient, all imaging results, lab results explained to the patient   Disposition Plan:  Anticipate discharge tomorrow      Consultants:  None  Procedures:  None  Antibiotics: Anti-infectives    None         HPI/Subjective: Feeling better than yesterday, less wheezing or shortness of breath  Objective: Vitals:   10/02/16 0010 10/02/16 0110 10/02/16 0514 10/02/16 0911  BP: 124/85  121/78   Pulse: 99  (!) 119   Resp: 18  (!) 22   Temp: 98.1 F (36.7 C)  98.2 F (36.8 C)   TempSrc: Oral  Oral   SpO2: 100% 99% 94% 99%  Weight: 82.7 kg (182 lb 4.8 oz)     Height: 5\' 7"  (1.702 m)       Intake/Output Summary (Last 24 hours) at 10/02/16 1209 Last data filed at 10/02/16 0800  Gross per 24 hour  Intake           503.75 ml  Output              200 ml  Net           303.75 ml    Exam:  Examination:  General exam: Appears calm and comfortable  Respiratory system: Clear to auscultation. Respiratory effort  normal. Cardiovascular system: S1 & S2 heard, RRR. No JVD, murmurs, rubs, gallops or clicks. No pedal edema. Gastrointestinal system: Abdomen is nondistended, soft and nontender. No organomegaly or masses felt. Normal bowel sounds heard. Central nervous system: Alert and oriented. No focal neurological deficits. Extremities: Symmetric 5 x 5 power. Skin: No rashes, lesions or ulcers Psychiatry: Judgement and insight appear normal. Mood & affect appropriate.     Data Reviewed: I have personally reviewed following labs and imaging studies  Micro Results No results found for this or any previous visit (from the past 240 hour(s)).  Radiology Reports Dg Chest 2 View  Result Date: 09/21/2016 CLINICAL DATA:  Dyspnea and congestion for 2 days.  08/27/2016 EXAM: CHEST  2 VIEW COMPARISON:  None. FINDINGS: The heart size and mediastinal contours are within normal limits. Both lungs are clear. The visualized skeletal structures are unremarkable. IMPRESSION: No active cardiopulmonary disease. Electronically Signed   By: Ellery Plunkaniel R Mitchell M.D.   On: 09/21/2016 20:56   Dg Chest Port 1 View  Result Date: 10/01/2016 CLINICAL DATA:  Cough and shortness of breath EXAM: PORTABLE CHEST 1 VIEW COMPARISON:  Chest radiograph 09/21/2016 FINDINGS: Cardiomediastinal contours are normal. No pneumothorax or pleural effusion. No focal airspace consolidation or pulmonary edema. IMPRESSION: Clear  lungs. Electronically Signed   By: Deatra RobinsonKevin  Herman M.D.   On: 10/01/2016 19:29     CBC  Recent Labs Lab 10/01/16 2043  WBC 8.1  HGB 15.2  HCT 45.2  PLT 311  MCV 89.5  MCH 30.1  MCHC 33.6  RDW 12.4    Chemistries   Recent Labs Lab 10/01/16 2043  NA 138  K 4.0  CL 103  CO2 25  GLUCOSE 105*  BUN 10  CREATININE 1.21  CALCIUM 9.2  MG 2.1   ------------------------------------------------------------------------------------------------------------------ estimated creatinine clearance is 91.8 mL/min (by C-G  formula based on SCr of 1.21 mg/dL). ------------------------------------------------------------------------------------------------------------------ No results for input(s): HGBA1C in the last 72 hours. ------------------------------------------------------------------------------------------------------------------ No results for input(s): CHOL, HDL, LDLCALC, TRIG, CHOLHDL, LDLDIRECT in the last 72 hours. ------------------------------------------------------------------------------------------------------------------ No results for input(s): TSH, T4TOTAL, T3FREE, THYROIDAB in the last 72 hours.  Invalid input(s): FREET3 ------------------------------------------------------------------------------------------------------------------ No results for input(s): VITAMINB12, FOLATE, FERRITIN, TIBC, IRON, RETICCTPCT in the last 72 hours.  Coagulation profile No results for input(s): INR, PROTIME in the last 168 hours.  No results for input(s): DDIMER in the last 72 hours.  Cardiac Enzymes No results for input(s): CKMB, TROPONINI, MYOGLOBIN in the last 168 hours.  Invalid input(s): CK ------------------------------------------------------------------------------------------------------------------ Invalid input(s): POCBNP   CBG: No results for input(s): GLUCAP in the last 168 hours.     Studies: Dg Chest Port 1 View  Result Date: 10/01/2016 CLINICAL DATA:  Cough and shortness of breath EXAM: PORTABLE CHEST 1 VIEW COMPARISON:  Chest radiograph 09/21/2016 FINDINGS: Cardiomediastinal contours are normal. No pneumothorax or pleural effusion. No focal airspace consolidation or pulmonary edema. IMPRESSION: Clear lungs. Electronically Signed   By: Deatra RobinsonKevin  Herman M.D.   On: 10/01/2016 19:29      No results found for: HGBA1C Lab Results  Component Value Date   CREATININE 1.21 10/01/2016       Scheduled Meds: . enoxaparin (LOVENOX) injection  40 mg Subcutaneous Q24H  .  ipratropium-albuterol  3 mL Nebulization Q6H  . methylPREDNISolone (SOLU-MEDROL) injection  80 mg Intravenous Q6H  . sodium chloride flush  3 mL Intravenous Q12H   Continuous Infusions: . 0.9 % NaCl with KCl 20 mEq / L 75 mL/hr at 10/02/16 0057  . albuterol 2.5 mg/hr (10/01/16 1923)     LOS: 0 days    Time spent: >30 MINS    Wellstar Douglas HospitalBROL,Miette Molenda  [facility] Pager [phone]. If 7PM-7AM, please contact night-coverage at [email or web address], password [email or web address] 10/02/2016, 12:09 PM  LOS: 0 days

## 2016-10-02 NOTE — Care Management Note (Signed)
Case Management Note Donn PieriniKristi Kynzie Polgar RN, BSN Unit 2W-Case Manager [phone]  Patient Details  Name: Ian Terry L Dalziel MRN: [id number] Date of Birth: [birth year]  Subjective/Objective:  Pt admitted with asthma                   Action/Plan: PTA pt lived at home, referral received for medication needs. Pt has used [facility] within last 12 mo (Sept, 17) will assess meds on discharge. Spoke with pt at bedside- per pt he has a nebulizer at home and neb medication, [facility] went to appointments- CM will call clinic and see if any f/u appointments available- [facility]- pt agreeable to this and states he will go to appointments this time.   Expected Discharge Date:                  Expected Discharge Plan:  Home/Self Care  In-House Referral:     Discharge planning Services  CM Consult, Medication Assistance, Indigent Health Clinic  Post Acute Care Choice:    Choice offered to:     DME Arranged:    DME Agency:     HH Arranged:    HH Agency:     Status of Service:  In process, will continue to follow  If discussed at Long Length of Stay Meetings, dates discussed:    Additional Comments:  Darrold SpanWebster, Fotios Amos Hall, RN 10/02/2016, 4:32 PM

## 2016-10-03 MED ORDER — PREDNISONE 5 MG PO TABS: ORAL_TABLET | ORAL | 0 refills | Status: DC

## 2016-10-03 MED ORDER — GUAIFENESIN ER 600 MG PO TB12: 600.0000 mg | ORAL_TABLET | Freq: Two times a day (BID) | ORAL | 0 refills | Status: DC

## 2016-10-03 MED ORDER — IPRATROPIUM-ALBUTEROL 0.5-2.5 (3) MG/3ML IN SOLN: 3.0000 mL | Freq: Four times a day (QID) | RESPIRATORY_TRACT | 1 refills | Status: DC | PRN

## 2016-10-03 MED ORDER — ALBUTEROL SULFATE (2.5 MG/3ML) 0.083% IN NEBU: 2.5000 mg | INHALATION_SOLUTION | RESPIRATORY_TRACT | 12 refills | Status: DC | PRN

## 2016-10-03 NOTE — Progress Notes (Signed)
Patient discharged teaching given including activity, diet, follow-up appointments and medication. Patient verbalized understanding of all discharge instructions. IV access was dc'd. Vitals are stable. Skin is intact. Pt to be escorted out by RN, to be driven home by family. 

## 2016-10-03 NOTE — Care Management Note (Signed)
Case Management Note Donn PieriniKristi Qunisha Bryk RN, BSN Unit 2W-Case Manager [phone]  Patient Details  Name: Ian Terry MRN: [id number] Date of Birth: [birth year]  Subjective/Objective:  Pt admitted with asthma                   Action/Plan: PTA pt lived at home, referral received for medication needs. Pt has used [facility] within last 12 mo (Sept, 17) will assess meds on discharge. Spoke with pt at bedside- per pt he has a nebulizer at home and neb medication, [facility] but [facility] went to appointments- CM will call clinic and see if any f/u appointments available- [facility] pt agreeable to this and states he will go to appointments this time.   Expected Discharge Date:     10/03/16             Expected Discharge Plan:  Home/Self Care  In-House Referral:     Discharge planning Services  CM Consult, Medication Assistance, Indigent Health Clinic, Follow-up appt scheduled  Post Acute Care Choice:    Choice offered to:     DME Arranged:    DME Agency:     HH Arranged:    HH Agency:     Status of Service:  Completed, signed off  If discussed at [employer] Length [employer] Meetings, dates discussed:    Additional Comments:  10/03/16- 1200- Skylynn Burkley RN, CM- pt for d/c home today- CM called [facility] this AM- they are not taking f/u appointments this week- was told to have pt call Dec.27 to schedule and appointment- [facility]- appointment made for Jan. 8 at 9:00- pt given this info- pt understands that [facility] he needs to call and cancel the other appointment- medications reviewed with pt- pt will have 2 medications to fill - that he states he will be able to afford - he has some nebs already at home. CM will not assist with [facility] on this admit.   Darrold SpanWebster, Eduin Friedel Hall, RN 10/03/2016, 1:51 PM

## 2016-10-03 NOTE — Progress Notes (Signed)
Patient ambulated on room air with saturations staying between 95-99%.

## 2016-10-03 NOTE — Discharge Summary (Signed)
Physician Discharge Summary  Ian Terry MRN: [id number] DOB/AGE: [birth year] 30 y.o.  PCP: No PCP Per Patient   Admit date: 10/01/2016 Discharge date: 10/03/2016  Discharge Diagnoses:    Principal Problem:   Asthma exacerbation Active Problems:   Legally blind   Moderate asthma with exacerbation    Follow-up recommendations Follow-up with PCP in 3-5 days , including all  additional recommended appointments as below Follow-up CBC, CMP in 3-5 days       Current Discharge Medication List    CONTINUE these medications which have CHANGED   Details  albuterol (PROVENTIL) (2.5 MG/3ML) 0.083% nebulizer solution Take 3 mLs (2.5 mg total) by nebulization every 2 (two) hours as needed for wheezing or shortness of breath. Qty: 75 mL, Refills: 12    guaiFENesin (MUCINEX) 600 MG 12 hr tablet Take 1 tablet (600 mg total) by mouth 2 (two) times daily. Qty: 14 tablet, Refills: 0    ipratropium-albuterol (DUONEB) 0.5-2.5 (3) MG/3ML SOLN Take 3 mLs by nebulization every 6 (six) hours as needed. Qty: 360 mL, Refills: 1    predniSONE (DELTASONE) 5 MG tablet 8 tablets for 3 days, 7 tablets for 3 days, 6 tablets for 3 days, 5 tablets for 3 days, 4 tablets for 3 days, 3 tablets for 3 days, 2 tablets for 3 days, 1 tablet for 3 days then discontinue Qty: 120 tablet, Refills: 0      CONTINUE these medications which have NOT CHANGED   Details  albuterol (PROVENTIL HFA;VENTOLIN HFA) 108 (90 Base) MCG/ACT inhaler Inhale 2 puffs into the lungs every 6 (six) hours as needed for wheezing or shortness of breath. Qty: 1 Inhaler, Refills: 2    Spacer/Aero-Holding Chambers (AEROCHAMBER PLUS) inhaler Use as instructed Qty: 1 each, Refills: 2      STOP taking these medications     penicillin v potassium (VEETID) 500 MG tablet          Discharge Condition: Stable Discharge Instructions Get Medicines reviewed and adjusted: Please take all your medications with you for your next visit with  your Primary MD  Please request your Primary MD to go over all hospital tests and procedure/radiological results at the follow up, please ask your Primary MD to get all Hospital records sent to his/her office.  If you experience worsening of your admission symptoms, develop shortness of breath, life threatening emergency, suicidal or homicidal thoughts you must seek medical attention immediately by calling 911 or calling your MD immediately if symptoms less severe.  You must read complete instructions/literature along with all the possible adverse reactions/side effects for all the Medicines you take and that have been prescribed to you. Take any new Medicines after you have completely understood and accpet all the possible adverse reactions/side effects.   Do not drive when taking Pain medications.   Do not take more than prescribed Pain, Sleep and Anxiety Medications  Special Instructions: If you have smoked or chewed Tobacco in the last 2 yrs please stop smoking, stop any regular Alcohol and or any Recreational drug use.  Wear Seat belts while driving.  Please note  You were cared for by a hospitalist during your hospital stay. Once you are discharged, your primary care physician will handle any further medical issues. Please note that NO REFILLS for any discharge medications will be authorized once you are discharged, as it is imperative that you return to your primary care physician (or establish a relationship with a primary care physician if you do not have  one) for your aftercare needs so that they can reassess your need for medications and monitor your lab values.     No Known Allergies    Disposition: 01-Home or Self Care   Consults: * None    Significant Diagnostic Studies:  Dg Chest 2 View  Result Date: 09/21/2016 CLINICAL DATA:  Dyspnea and congestion for 2 days.  08/27/2016 EXAM: CHEST  2 VIEW COMPARISON:  None. FINDINGS: The heart size and mediastinal contours  are within normal limits. Both lungs are clear. The visualized skeletal structures are unremarkable. IMPRESSION: No active cardiopulmonary disease. Electronically Signed   By: Andreas Newport M.D.   On: 09/21/2016 20:56   Dg Chest Port 1 View  Result Date: 10/01/2016 CLINICAL DATA:  Cough and shortness of breath EXAM: PORTABLE CHEST 1 VIEW COMPARISON:  Chest radiograph 09/21/2016 FINDINGS: Cardiomediastinal contours are normal. No pneumothorax or pleural effusion. No focal airspace consolidation or pulmonary edema. IMPRESSION: Clear lungs. Electronically Signed   By: Ulyses Jarred M.D.   On: 10/01/2016 19:29      Filed Weights   10/02/16 0010  Weight: 82.7 kg (182 lb 4.8 oz)     Microbiology: No results found for this or any previous visit (from the past 240 hour(s)).     Blood Culture    Component Value Date/Time   SDES BLOOD LEFT ARM 07/09/2016 0720   SPECREQUEST BOTTLES DRAWN AEROBIC AND ANAEROBIC 5CC EACH 07/09/2016 0720   CULT NO GROWTH 5 DAYS 07/09/2016 0720   REPTSTATUS 07/14/2016 FINAL 07/09/2016 0720      Labs: Results for orders placed or performed during the hospital encounter of 10/01/16 (from the past 48 hour(s))  CBC     Status: None   Collection Time: 10/01/16  8:43 PM  Result Value Ref Range   WBC 8.1 4.0 - 10.5 K/uL   RBC 5.05 4.22 - 5.81 MIL/uL   Hemoglobin 15.2 13.0 - 17.0 g/dL   HCT 45.2 39.0 - 52.0 %   MCV 89.5 78.0 - 100.0 fL   MCH 30.1 26.0 - 34.0 pg   MCHC 33.6 30.0 - 36.0 g/dL   RDW 12.4 11.5 - 15.5 %   Platelets 311 150 - 400 K/uL  Basic metabolic panel     Status: Abnormal   Collection Time: 10/01/16  8:43 PM  Result Value Ref Range   Sodium 138 135 - 145 mmol/L   Potassium 4.0 3.5 - 5.1 mmol/L   Chloride 103 101 - 111 mmol/L   CO2 25 22 - 32 mmol/L   Glucose, Bld 105 (H) 65 - 99 mg/dL   BUN 10 6 - 20 mg/dL   Creatinine, Ser 1.21 0.61 - 1.24 mg/dL   Calcium 9.2 8.9 - 10.3 mg/dL   GFR calc non Af Amer >60 >60 mL/min   GFR calc Af  Amer >60 >60 mL/min    Comment: (NOTE) The eGFR has been calculated using the CKD EPI equation. This calculation has not been validated in all clinical situations. eGFR's persistently <60 mL/min signify possible Chronic Kidney Disease.    Anion gap 10 5 - 15  Magnesium     Status: None   Collection Time: 10/01/16  8:43 PM  Result Value Ref Range   Magnesium 2.1 1.7 - 2.4 mg/dL  Phosphorus     Status: None   Collection Time: 10/01/16  8:43 PM  Result Value Ref Range   Phosphorus 3.0 2.5 - 4.6 mg/dL  D-dimer, quantitative (not at [facility])  Status: None   Collection Time: 10/02/16 11:21 AM  Result Value Ref Range   D-Dimer, Quant 0.29 0.00 - 0.50 ug/mL-FEU    Comment: (NOTE) At the manufacturer cut-off of 0.50 ug/mL FEU, this assay has been documented to exclude PE with a sensitivity and negative predictive value of 97 to 99%.  At this time, this assay has not been approved by the FDA to exclude DVT/VTE. Results should be correlated with clinical presentation.      Lipid Panel  No results found for: CHOL, TRIG, HDL, CHOLHDL, VLDL, LDLCALC, LDLDIRECT   No results found for: HGBA1C   Lab Results  Component Value Date   CREATININE 1.21 10/01/2016     HPI :*  Ian Terry is a 30 y.o. male with medical history significant of chronic persistent asthma and right eye blindness was coming to the emergency department with progressively worse nonproductive cough, wheezing and dyspnea for several days.  Per patient, since last week he has been having significant difficulty due to his asthma symptoms. He has states that he is sometimes unable to go take his daughter out to the school bus without feeling tired and fatigued. Sometimes after doing this task, he has to immediately take a nebulizer treatment. He states that he was well controlled with the Symbicort inhaler, but he is unable to afford it now. His symptoms have been significant enough that he has 4-5 visits to the ED in the  past few weeks. He denies fever, chills, headache, rhinorrhea, earache, sore throat or productive cough.  ED Course: Patient received supplemental oxygen, IV magnesium, IV Solu-Medrol, albuterol and ipratropium via nebulizer.    HOSPITAL COURSE:   Asthma exacerbation No evidence of pneumonia on chest x-ray D-dimer negative Supportive treatment with IV steroids, nebulizer treatments Now switched to prednisone taper 97% with ambulation on room air   Legally blind Supportive care   Discharge Exam:   Blood pressure (!) 107/41, pulse 81, temperature 97.3 F (36.3 C), temperature source Oral, resp. rate 16, height 5' 7"  (1.702 m), weight 82.7 kg (182 lb 4.8 oz), SpO2 97 %.   General exam: Appears calm and comfortable  Respiratory system: Clear to auscultation. Respiratory effort normal. Cardiovascular system: S1 & S2 heard, RRR. No JVD, murmurs, rubs, gallops or clicks. No pedal edema. Gastrointestinal system: Abdomen is nondistended, soft and nontender. No organomegaly or masses felt. Normal bowel sounds heard. Central nervous system: Alert and oriented. No focal neurological deficits. Extremities: Symmetric 5 x 5 power. Skin: No rashes, lesions or ulcers Psychiatry: Judgement and insight appear normal. Mood & affect appropriate.    Follow-up Information    Primary care provider. Call in 2 day(s).   Why:  Hospital follow-up          Signed: Reyne Dumas 10/03/2016, 7:53 AM        Time spent >45 mins

## 2016-10-21 ENCOUNTER — Ambulatory Visit: Payer: Self-pay | Admitting: Family Medicine

## 2017-03-20 ENCOUNTER — Emergency Department (HOSPITAL_COMMUNITY)
Admission: EM | Admit: 2017-03-20 | Discharge: 2017-03-21 | Disposition: A | Discharge status: Home / Self Care | Payer: Self-pay | Attending: Emergency Medicine | Admitting: Emergency Medicine

## 2017-03-20 ENCOUNTER — Emergency Department (HOSPITAL_COMMUNITY): Payer: Self-pay

## 2017-03-20 ENCOUNTER — Encounter (HOSPITAL_COMMUNITY): Payer: Self-pay

## 2017-03-20 DIAGNOSIS — J45901 Unspecified asthma with (acute) exacerbation: Secondary | ICD-10-CM

## 2017-03-20 DIAGNOSIS — J4521 Mild intermittent asthma with (acute) exacerbation: Secondary | ICD-10-CM | POA: Insufficient documentation

## 2017-03-20 DIAGNOSIS — Z87891 Personal history of nicotine dependence: Secondary | ICD-10-CM | POA: Insufficient documentation

## 2017-03-20 MED ORDER — ALBUTEROL (5 MG/ML) CONTINUOUS INHALATION SOLN
15.0000 mg/h | INHALATION_SOLUTION | Freq: Once | RESPIRATORY_TRACT | Status: AC
  Administered 2017-03-21: 15 mg/h via RESPIRATORY_TRACT
  Filled 2017-03-20: qty 20

## 2017-03-20 MED ORDER — SODIUM CHLORIDE 0.9 % IV SOLN
Freq: Once | INTRAVENOUS | Status: AC
  Administered 2017-03-21: via INTRAVENOUS

## 2017-03-20 MED ORDER — ALBUTEROL SULFATE (2.5 MG/3ML) 0.083% IN NEBU
INHALATION_SOLUTION | RESPIRATORY_TRACT | Status: AC
  Filled 2017-03-20: qty 6

## 2017-03-20 MED ORDER — METHYLPREDNISOLONE SODIUM SUCC 125 MG IJ SOLR
125.0000 mg | Freq: Once | INTRAMUSCULAR | Status: AC
Start: 2017-03-21 — End: 2017-03-21
  Administered 2017-03-21: 125 mg via INTRAVENOUS
  Filled 2017-03-20: qty 2

## 2017-03-20 MED ORDER — ALBUTEROL SULFATE (2.5 MG/3ML) 0.083% IN NEBU
5.0000 mg | INHALATION_SOLUTION | Freq: Once | RESPIRATORY_TRACT | Status: AC
  Administered 2017-03-20: 5 mg via RESPIRATORY_TRACT

## 2017-03-20 NOTE — ED Notes (Signed)
Transported to xray 

## 2017-03-20 NOTE — ED Provider Notes (Addendum)
[facility]-EMERGENCY DEPT Provider Note   CSN: [id number] Arrival date & time: 03/20/17  2111     History   Chief Complaint Chief Complaint  Patient presents with  . Asthma    HPI [name] is a 31 y.o. male.  This is a 31 year old with a history of asthma who has very little insight into his disease.  He reports that he is using his albuterol inhaler occasionally but not on a regular basis.  He also supposed to be using Symbicort but states due to its expense.  He cannot afford it and does not understand why he is having asthma attacks.      Past Medical History:  Diagnosis Date  . Asthma   . Legal blindness    right eye only     Patient Active Problem List   Diagnosis Date Noted  . Moderate asthma with exacerbation   . 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 Asthma exacerbation 07/09/2016  . Sepsis (HCC) 07/09/2016  . Streptococcal sore throat 07/09/2016  . PNEUMONIA, RIGHT LOWER LOBE 02/01/2008  . ASTHMA 02/01/2008    Past Surgical History:  Procedure Laterality Date  . EYE SURGERY         Home Medications    Prior to Admission medications   Medication Sig Start Date End Date Taking? Authorizing Provider  albuterol (PROVENTIL HFA;VENTOLIN HFA) 108 (90 Base) MCG/ACT inhaler Inhale 2 puffs into the lungs every 6 (six) hours as needed for wheezing or shortness of breath. 09/21/16   Jerelyn Scott, MD  albuterol (PROVENTIL) (2.5 MG/3ML) 0.083% nebulizer solution Take 3 mLs (2.5 mg total) by nebulization every 2 (two) hours as needed for wheezing or shortness of breath. 10/03/16   Richarda Overlie, MD  guaiFENesin (MUCINEX) 600 MG 12 hr tablet Take 1 tablet (600 mg total) by mouth 2 (two) times daily. 10/03/16   Richarda Overlie, MD  ipratropium-albuterol (DUONEB) 0.5-2.5 (3) MG/3ML SOLN Take 3 mLs by nebulization every 6 (six) hours as needed. 10/03/16   Richarda Overlie, MD  predniSONE (DELTASONE) 10 MG tablet Take 2 tablets (20 mg total) by mouth daily. 03/21/17   Earley Favor, NP    Spacer/Aero-Holding Chambers (AEROCHAMBER PLUS) inhaler Use as instructed Patient not taking: Reported on 10/01/2016 07/11/16   Merlene Laughter, DO    Family History Family History  Problem Relation Age of Onset  . Vitiligo Mother     Social History Social History  Substance Use Topics  . Smoking status: Former Smoker    Quit date: 07/02/2016  . Smokeless tobacco: Never Used  . Alcohol use No     Allergies   Patient has no known allergies.   Review of Systems Review of Systems  Respiratory: Positive for shortness of breath and wheezing.      Physical Exam Updated Vital Signs BP 113/73   Pulse 76   Temp 98.6 F (37 C) (Oral)   Resp 17   Ht 5\' 7"  (1.702 m)   Wt 81.6 kg (180 lb)   SpO2 94%   BMI 28.19 kg/m   Physical Exam  Vitals reviewed.    ED Treatments / Results  Labs (all labs ordered are listed, but only abnormal results are displayed) Labs Reviewed - No data to display  EKG  EKG Interpretation None       Radiology No results found.  Procedures Procedures (including critical care time)  Medications Ordered in ED Medications  albuterol (PROVENTIL) (2.5 MG/3ML) 0.083% nebulizer solution 5 mg (5 mg Nebulization Given  03/20/17 2121)  0.9 %  sodium chloride infusion ( Intravenous Stopped 03/21/17 0222)  methylPREDNISolone sodium succinate (SOLU-MEDROL) 125 mg/2 mL injection 125 mg (125 mg Intravenous Given 03/21/17 0014)  albuterol (PROVENTIL,VENTOLIN) solution continuous neb (15 mg/hr Nebulization Given 03/21/17 0003)  magnesium sulfate IVPB 2 g 50 mL (0 g Intravenous Stopped 03/21/17 0147)     Initial Impression / Assessment and Plan / ED Course  I have reviewed the triage vital signs and the nursing notes.  Pertinent labs & imaging results that were available during my care of the patient were reviewed by me and considered in my medical decision making (see chart for details).      Given albuterol treatemt 2 GM Magnesium IV as well as  Solumederol with resolution of his symptoms Has been give Rx for Prednisone and instructions for Albuterol use   Final Clinical Impressions(s) / ED Diagnoses   Final diagnoses:  Mild asthma with exacerbation, unspecified whether persistent    New Prescriptions Discharge Medication List as of 03/21/2017  2:16 AM       Earley FavorSchulz, Galilea [place]uito, NP 03/21/17 [id number]    Geoffery Lyonselo, Douglas, MD 03/21/17 0423    Earley FavorSchulz, Lamiyah Schlotter, NP 04/14/17 [id number]    Geoffery Lyonselo, Douglas, MD 04/14/17 2341

## 2017-03-20 NOTE — ED Triage Notes (Signed)
Pt endorses asthma/wheezing that began this morning. Pt coughing in triage but able to speak in complete sentences. Does not have a rescue inhaler but has been using nebulizer and states "It only helps sometimes"

## 2017-03-21 MED ORDER — MAGNESIUM SULFATE 2 GM/50ML IV SOLN
2.0000 g | Freq: Once | INTRAVENOUS | Status: AC
  Administered 2017-03-21: 2 g via INTRAVENOUS
  Filled 2017-03-21: qty 50

## 2017-03-21 MED ORDER — PREDNISONE 10 MG PO TABS: 20.0000 mg | ORAL_TABLET | Freq: Every day | ORAL | 0 refills | Status: DC

## 2017-03-21 NOTE — Discharge Instructions (Signed)
You have been given a prescription for Predniosne  take as directed until all tablets taken [place] ey[place] inhaler 2 puffs every 4-6 hours while awake for 2 days than as needed

## 2017-05-22 IMAGING — DX DG CHEST 2V
2 series · 2 of 2 positions shown · non-contrast
Comparison: None.

CLINICAL DATA: Dyspnea and congestion for 2 days.  08/27/2016

EXAM:
CHEST  2 VIEW

[chest pa]
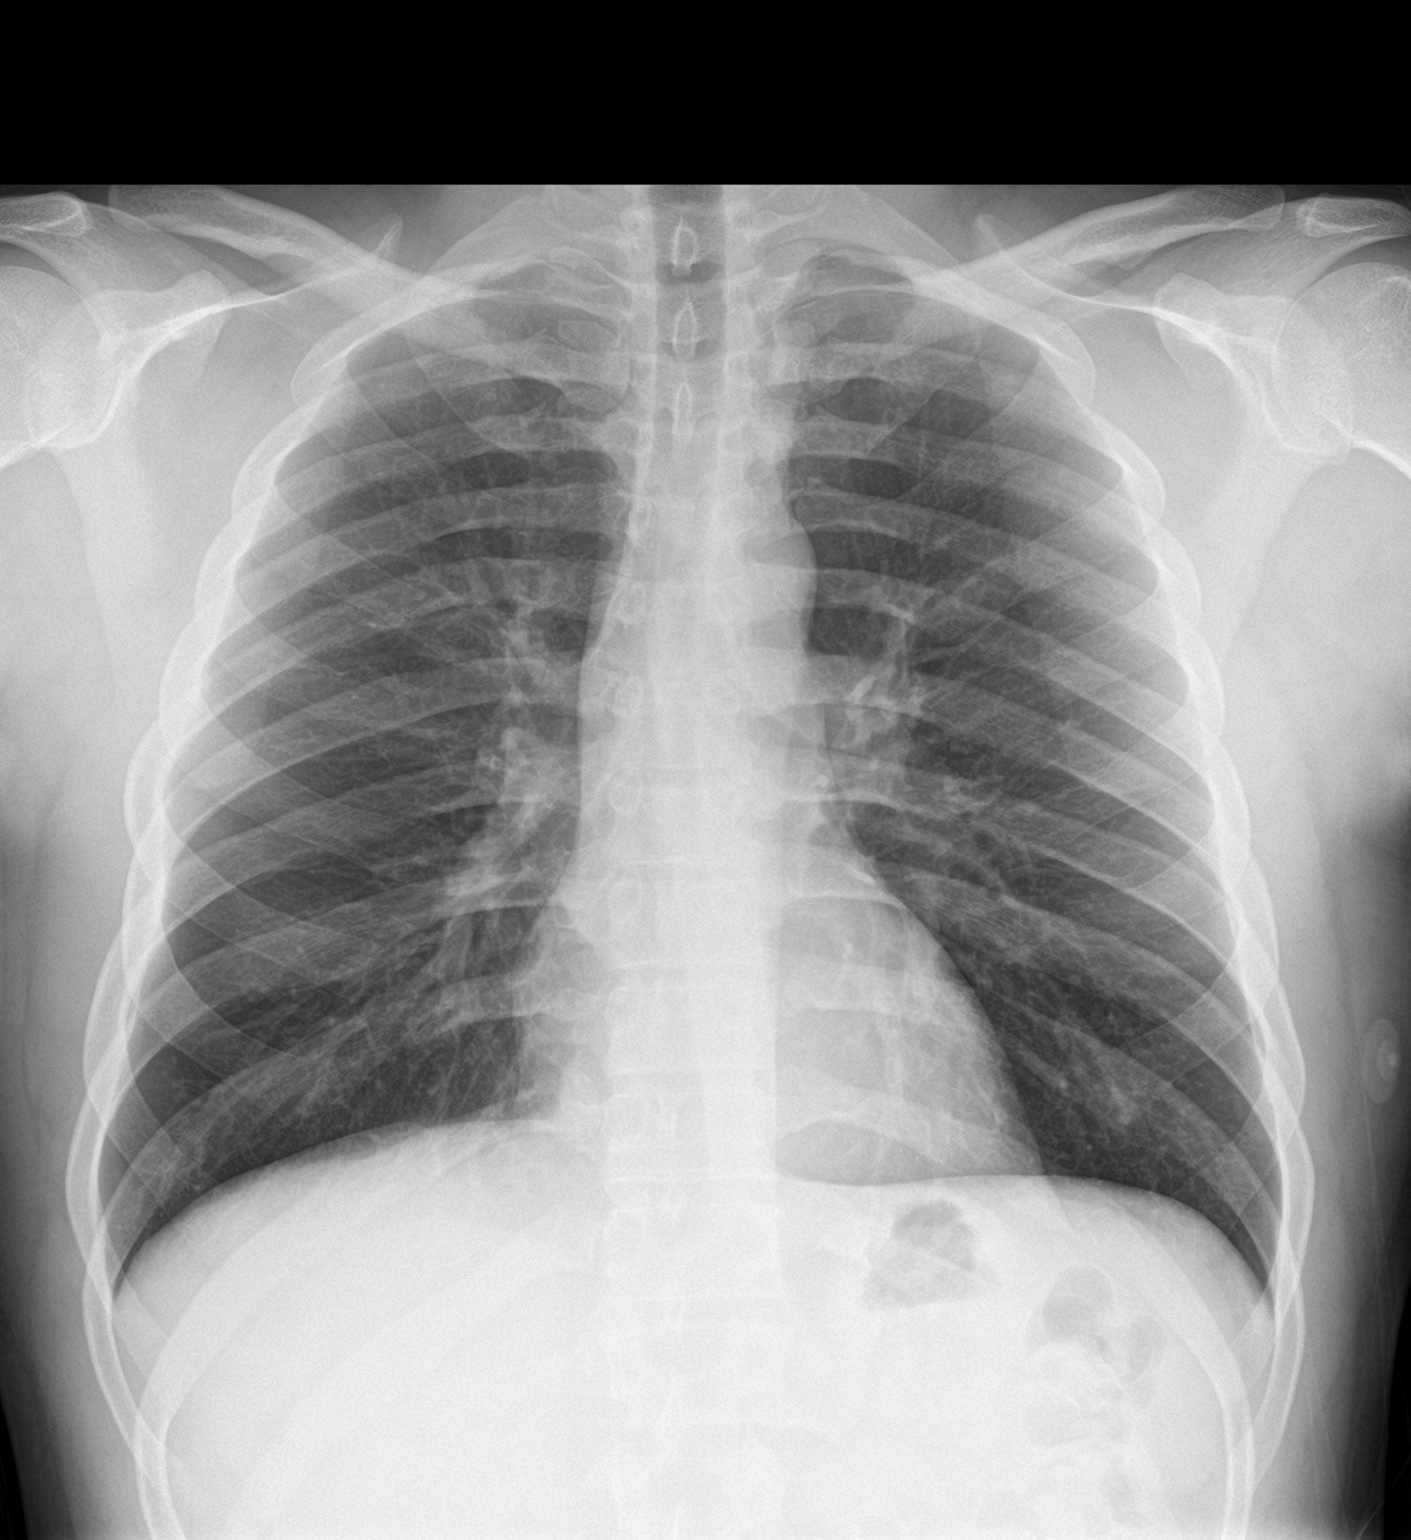

[chest lat]
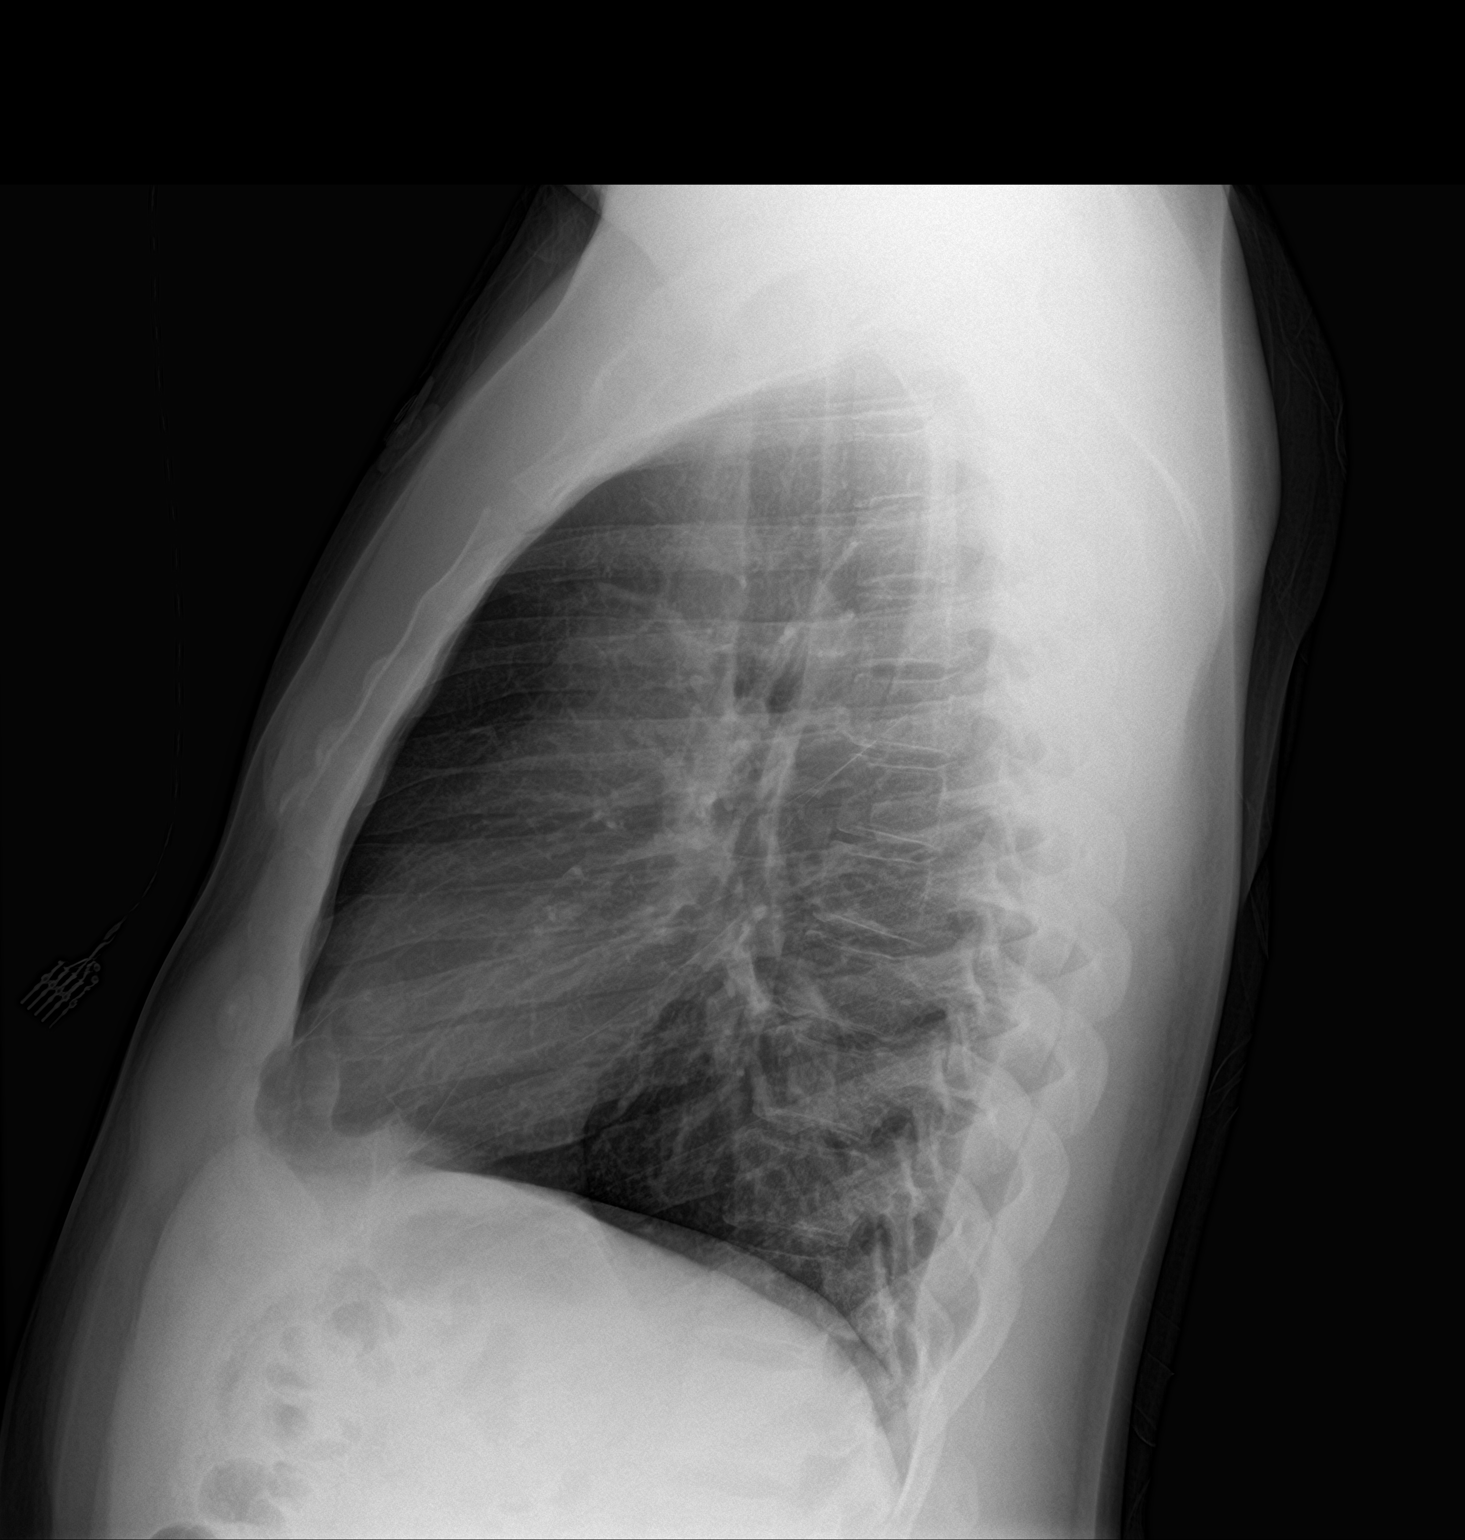

[2 of 2 positions shown; findings below may reference images not displayed]

FINDINGS: The heart size and mediastinal contours are within normal limits.
Both lungs are clear. The visualized skeletal structures are
unremarkable.
IMPRESSION: No active cardiopulmonary disease.

## 2017-08-25 ENCOUNTER — Emergency Department (HOSPITAL_COMMUNITY): Payer: Self-pay

## 2017-08-25 ENCOUNTER — Encounter (HOSPITAL_COMMUNITY): Payer: Self-pay

## 2017-08-25 ENCOUNTER — Emergency Department (HOSPITAL_COMMUNITY)
Admission: EM | Admit: 2017-08-25 | Discharge: 2017-08-25 | Disposition: A | Discharge status: Home / Self Care | Payer: Self-pay | Attending: Emergency Medicine | Admitting: Emergency Medicine

## 2017-08-25 ENCOUNTER — Other Ambulatory Visit: Payer: Self-pay

## 2017-08-25 DIAGNOSIS — Z79899 Other long term (current) drug therapy: Secondary | ICD-10-CM | POA: Insufficient documentation

## 2017-08-25 DIAGNOSIS — R05 Cough: Secondary | ICD-10-CM | POA: Insufficient documentation

## 2017-08-25 DIAGNOSIS — J4520 Mild intermittent asthma, uncomplicated: Secondary | ICD-10-CM | POA: Insufficient documentation

## 2017-08-25 DIAGNOSIS — Z87891 Personal history of nicotine dependence: Secondary | ICD-10-CM | POA: Insufficient documentation

## 2017-08-25 DIAGNOSIS — R0602 Shortness of breath: Secondary | ICD-10-CM | POA: Insufficient documentation

## 2017-08-25 MED ORDER — ALBUTEROL SULFATE (2.5 MG/3ML) 0.083% IN NEBU
5.0000 mg | INHALATION_SOLUTION | Freq: Once | RESPIRATORY_TRACT | Status: AC
  Administered 2017-08-25: 5 mg via RESPIRATORY_TRACT
  Filled 2017-08-25: qty 6

## 2017-08-25 MED ORDER — ALBUTEROL SULFATE HFA 108 (90 BASE) MCG/ACT IN AERS: 2.0000 | INHALATION_SPRAY | RESPIRATORY_TRACT | 0 refills | Status: DC | PRN

## 2017-08-25 MED ORDER — DEXAMETHASONE 10 MG/ML FOR PEDIATRIC ORAL USE
10.0000 mg | Freq: Once | INTRAMUSCULAR | Status: AC
  Administered 2017-08-25: 10 mg via ORAL
  Filled 2017-08-25: qty 1

## 2017-08-25 MED ORDER — ALBUTEROL SULFATE (2.5 MG/3ML) 0.083% IN NEBU: 2.5000 mg | INHALATION_SOLUTION | Freq: Four times a day (QID) | RESPIRATORY_TRACT | 12 refills | Status: DC | PRN

## 2017-08-25 NOTE — ED Triage Notes (Signed)
Per Pt, Pt is coming from home where he reports having increased of wheezing secondary to asthma. Productive cough with green sputum and some expiratory wheeze noted upon assessment.

## 2017-08-25 NOTE — ED Provider Notes (Signed)
[facility]   CSN: [id number] Arrival date & time: 08/25/17  1149     History   Chief Complaint Chief Complaint  Patient presents with  . Asthma    HPI Ian Terry is a 31 y.o. male.  Patient asthma history no local doctor presents with asthma exacerbation. Patient's had worsening wheezing and mild breathing difficulty for the past few days. Typically occurs with weather changes. No fevers or chills.atient has been admitted in the past for asthma but not intubated. Patient has had a productive cough.vaccines up-to-date      Past Medical History:  Diagnosis Date  . Asthma   . Legal blindness    right eye only     Patient Active Problem List   Diagnosis Date Noted  . Moderate asthma with exacerbation   . 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 Asthma exacerbation 07/09/2016  . Sepsis (HCC) 07/09/2016  . Streptococcal sore throat 07/09/2016  . PNEUMONIA, RIGHT LOWER LOBE 02/01/2008  . ASTHMA 02/01/2008    Past Surgical History:  Procedure Laterality Date  . EYE SURGERY         Home Medications    Prior to Admission medications   Medication Sig Start Date End Date Taking? Authorizing Provider  albuterol (PROVENTIL HFA;VENTOLIN HFA) 108 (90 Base) MCG/ACT inhaler Inhale 2 puffs into the lungs every 6 (six) hours as needed for wheezing or shortness of breath. 09/21/16   Mabe, Latanya MaudlinMartha L, MD  albuterol (PROVENTIL) (2.5 MG/3ML) 0.083% nebulizer solution Take 3 mLs (2.5 mg total) by nebulization every 2 (two) hours as needed for wheezing or shortness of breath. 10/03/16   Richarda OverlieAbrol, Nayana, MD  guaiFENesin (MUCINEX) 600 MG 12 hr tablet Take 1 tablet (600 mg total) by mouth 2 (two) times daily. 10/03/16   Richarda OverlieAbrol, Nayana, MD  ipratropium-albuterol (DUONEB) 0.5-2.5 (3) MG/3ML SOLN Take 3 mLs by nebulization every 6 (six) hours as needed. 10/03/16   Richarda OverlieAbrol, Nayana, MD  predniSONE (DELTASONE) 10 MG tablet Take 2 tablets (20 mg total) by  mouth daily. 03/21/17   Earley FavorSchulz, Gail, NP  Spacer/Aero-Holding Chambers (AEROCHAMBER PLUS) inhaler Use as instructed Patient not taking: Reported on 10/01/2016 07/11/16   Merlene LaughterSheikh, Omair Latif, DO    Family History Family History  Problem Relation Age of Onset  . Vitiligo Mother     Social History Social History   Tobacco Use  . Smoking status: Former Smoker    Last attempt to quit: 07/02/2016    Years since quitting: 1.1  . Smokeless tobacco: Never Used  Substance Use Topics  . Alcohol use: No  . Drug use: No     Allergies   Patient has no known allergies.   Review of Systems Review of Systems  Constitutional: Negative for chills and fever.  HENT: Negative for congestion.   Respiratory: Positive for cough, shortness of breath and wheezing.   Cardiovascular: Negative for chest pain.  Gastrointestinal: Negative for abdominal pain and vomiting.  Musculoskeletal: Negative for back pain, neck pain and neck stiffness.  Skin: Negative for rash.  Neurological: Negative for light-headedness and headaches.     Physical Exam Updated Vital Signs BP 127/83 (BP Location: Right Arm)   Pulse 85   Temp 99.2 F (37.3 C) (Oral)   Resp 16   Ht 5\' 7"  (1.702 m)   Wt 81.6 kg (180 lb)   SpO2 94%   BMI 28.19 kg/m   Physical Exam  Constitutional: He is oriented to person, place, and  time. He appears well-developed and well-nourished.  HENT:  Head: Normocephalic and atraumatic.  Eyes: Conjunctivae are normal. Right eye exhibits no discharge. Left eye exhibits no discharge.  Neck: Normal range of motion. Neck supple. No tracheal deviation present.  Cardiovascular: Normal rate and regular rhythm.  Pulmonary/Chest: Effort normal. He has wheezes (mild expiratory bilateral).  Abdominal: Soft. He exhibits no distension. There is no tenderness. There is no guarding.  Musculoskeletal: He exhibits no edema.  Neurological: He is alert and oriented to person, place, and time.  Skin: Skin is  warm. No rash noted.  Psychiatric: He has a normal mood and affect.  Nursing note and vitals reviewed.    ED Treatments / Results  Labs (all labs ordered are listed, but only abnormal results are displayed) Labs Reviewed - No data to display  EKG  EKG Interpretation None       Radiology Dg Chest 2 View  Result Date: 08/25/2017 CLINICAL DATA:  Asthma, cough that has subsisted since arriving here and receiving a breathing treatment. Hx of asthma, pt has a breathing treatment at home. EXAM: CHEST  2 VIEW COMPARISON:  03/20/2017 FINDINGS: The heart size and mediastinal contours are within normal limits. Both lungs are clear. The visualized skeletal structures are unremarkable. IMPRESSION: No active cardiopulmonary disease. Electronically Signed   By: Elige KoHetal  Patel   On: 08/25/2017 12:43    Procedures Procedures (including critical care time)  Medications Ordered in ED Medications  albuterol (PROVENTIL) (2.5 MG/3ML) 0.083% nebulizer solution 5 mg (5 mg Nebulization Given 08/25/17 1209)  albuterol (PROVENTIL) (2.5 MG/3ML) 0.083% nebulizer solution 5 mg (5 mg Nebulization Given 08/25/17 1305)  dexamethasone (DECADRON) 10 MG/ML injection for Pediatric ORAL use 10 mg (10 mg Oral Given 08/25/17 1305)     Initial Impression / Assessment and Plan / ED Course  I have reviewed the triage vital signs and the nursing notes.  Pertinent labs & imaging results that were available during my care of the patient were reviewed by me and considered in my medical decision making (see chart for details).    Overall well-appearing and all presents with clinically asthma exacerbation likely from mother change/viral infection. Plan for albuterol treatment, steroids and close outpatient follow-up. Chest x-ray was reviewed no acute abnormalities.  Results and differential diagnosis were discussed with the patient/parent/guardian. Xrays were independently reviewed by myself.  Close follow up outpatient  was discussed, comfortable with the plan.   Medications  albuterol (PROVENTIL) (2.5 MG/3ML) 0.083% nebulizer solution 5 mg (5 mg Nebulization Given 08/25/17 1209)  albuterol (PROVENTIL) (2.5 MG/3ML) 0.083% nebulizer solution 5 mg (5 mg Nebulization Given 08/25/17 1305)  dexamethasone (DECADRON) 10 MG/ML injection for Pediatric ORAL use 10 mg (10 mg Oral Given 08/25/17 1305)    Vitals:   08/25/17 1202 08/25/17 1203  BP: 127/83   Pulse: 85   Resp: 16   Temp: 99.2 F (37.3 C)   TempSrc: Oral   SpO2: 94%   Weight:  81.6 kg (180 lb)  Height:  5\' 7"  (1.702 m)    Final diagnoses:  Mild intermittent asthma without complication    Final Clinical Impressions(s) / ED Diagnoses   Final diagnoses:  None    ED Discharge Orders    None       Blane OharaZavitz, Sophiarose Eades, MD 08/25/17 1317

## 2017-08-25 NOTE — ED Notes (Signed)
Pt feeling better after the 2nd neb.  Pt given sprite to drink

## 2017-08-25 NOTE — Discharge Instructions (Addendum)
Use albuterol as needed . Return for worsening breathing difficulties that do not improve with treatments.

## 2017-08-25 NOTE — ED Notes (Signed)
Pt going to x-ray.  They will bring him to peds when done

## 2017-08-25 NOTE — ED Notes (Signed)
Pt still with exp wheezing on auscultation

## 2017-09-03 ENCOUNTER — Encounter (HOSPITAL_COMMUNITY): Payer: Self-pay

## 2017-09-03 ENCOUNTER — Other Ambulatory Visit: Payer: Self-pay

## 2017-09-03 ENCOUNTER — Emergency Department (HOSPITAL_COMMUNITY): Payer: Self-pay

## 2017-09-03 ENCOUNTER — Emergency Department (HOSPITAL_COMMUNITY)
Admission: EM | Admit: 2017-09-03 | Discharge: 2017-09-04 | Disposition: A | Discharge status: Home / Self Care | Payer: Self-pay | Attending: Emergency Medicine | Admitting: Emergency Medicine

## 2017-09-03 DIAGNOSIS — Z79899 Other long term (current) drug therapy: Secondary | ICD-10-CM | POA: Insufficient documentation

## 2017-09-03 DIAGNOSIS — J020 Streptococcal pharyngitis: Secondary | ICD-10-CM | POA: Insufficient documentation

## 2017-09-03 DIAGNOSIS — J45901 Unspecified asthma with (acute) exacerbation: Secondary | ICD-10-CM | POA: Insufficient documentation

## 2017-09-03 DIAGNOSIS — Z87891 Personal history of nicotine dependence: Secondary | ICD-10-CM | POA: Insufficient documentation

## 2017-09-03 LAB — RAPID STREP SCREEN (MED CTR MEBANE ONLY): STREPTOCOCCUS, GROUP A SCREEN (DIRECT): POSITIVE — AB

## 2017-09-03 MED ORDER — IPRATROPIUM-ALBUTEROL 0.5-2.5 (3) MG/3ML IN SOLN
3.0000 mL | Freq: Once | RESPIRATORY_TRACT | Status: AC
  Administered 2017-09-04: 3 mL via RESPIRATORY_TRACT
  Filled 2017-09-03: qty 3

## 2017-09-03 MED ORDER — ALBUTEROL SULFATE (2.5 MG/3ML) 0.083% IN NEBU
5.0000 mg | INHALATION_SOLUTION | Freq: Once | RESPIRATORY_TRACT | Status: AC
  Administered 2017-09-03: 5 mg via RESPIRATORY_TRACT
  Filled 2017-09-03: qty 6

## 2017-09-03 MED ORDER — PREDNISONE 20 MG PO TABS
40.0000 mg | ORAL_TABLET | Freq: Once | ORAL | Status: AC
  Administered 2017-09-04: 40 mg via ORAL
  Filled 2017-09-03: qty 2

## 2017-09-03 MED ORDER — HYDROCODONE-HOMATROPINE 5-1.5 MG/5ML PO SYRP
5.0000 mL | ORAL_SOLUTION | Freq: Once | ORAL | Status: AC
  Administered 2017-09-04: 5 mL via ORAL
  Filled 2017-09-03: qty 5

## 2017-09-03 MED ORDER — KETOROLAC TROMETHAMINE 30 MG/ML IJ SOLN
15.0000 mg | Freq: Once | INTRAMUSCULAR | Status: AC
  Administered 2017-09-04: 15 mg via INTRAMUSCULAR
  Filled 2017-09-03: qty 1

## 2017-09-03 NOTE — ED Triage Notes (Signed)
Pt states that he is begging to get a cold with stuffy and sore throat. Pt asthma has been uncontrolled by home meds and nebs today. Inspiratory wheezing and SOB.

## 2017-09-04 MED ORDER — BENZONATATE 100 MG PO CAPS: 100.0000 mg | ORAL_CAPSULE | Freq: Three times a day (TID) | ORAL | 0 refills | Status: DC

## 2017-09-04 MED ORDER — PREDNISONE 20 MG PO TABS: 40.0000 mg | ORAL_TABLET | Freq: Every day | ORAL | 0 refills | Status: DC

## 2017-09-04 MED ORDER — FLUTICASONE PROPIONATE 50 MCG/ACT NA SUSP: 2.0000 | Freq: Every day | NASAL | 12 refills | Status: DC

## 2017-09-04 MED ORDER — PENICILLIN G BENZATHINE 1200000 UNIT/2ML IM SUSP
1.2000 10*6.[IU] | Freq: Once | INTRAMUSCULAR | Status: AC
  Administered 2017-09-04: 1.2 10*6.[IU] via INTRAMUSCULAR
  Filled 2017-09-04: qty 2

## 2017-09-04 NOTE — ED Provider Notes (Signed)
[facility] Provider Note   CSN: [id number] Arrival date & time: 09/03/17  2048     History   Chief Complaint Chief Complaint  Patient presents with  . Asthma    HPI Ian Terry is a 31 y.o. male.  HPI 31 year old African-American male past medical history significant for asthma presents to the emergency department today with complaints of sore throat, nasal congestion, cough, wheezing, shortness of breath.  Patient states that he has developed a cold that started with his sore throat.  States that it has since caused his asthma to act up.  States that he has been wheezing for the past 2-3 days.  States his symptoms started 3 days ago.  He has been using his inhaler at home along with his nebulizer with only little relief.  Reports a nonproductive cough.  Denies any associated fevers, otalgia.  Denies any known sick contacts.  States that URI will exacerbate his asthma.  Patient denies trying any other medications at home for his cold-like symptoms.  Patient denies any associated chest pain, nausea, vomiting, headaches, vision changes, lightheadedness, dizziness. Past Medical History:  Diagnosis Date  . Asthma   . Legal blindness    right eye only     Patient Active Problem List   Diagnosis Date Noted  . Moderate asthma with exacerbation   . 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 Asthma exacerbation 07/09/2016  . Sepsis (HCC) 07/09/2016  . Streptococcal sore throat 07/09/2016  . PNEUMONIA, RIGHT LOWER LOBE 02/01/2008  . ASTHMA 02/01/2008    Past Surgical History:  Procedure Laterality Date  . EYE SURGERY         Home Medications    Prior to Admission medications   Medication Sig Start Date End Date Taking? Authorizing Provider  albuterol (PROVENTIL HFA;VENTOLIN HFA) 108 (90 Base) MCG/ACT inhaler Inhale 2 puffs into the lungs every 6 (six) hours as needed for wheezing or shortness of breath. 09/21/16  Yes Mabe, Latanya MaudlinMartha L, MD  albuterol  (PROVENTIL) (2.5 MG/3ML) 0.083% nebulizer solution Take 3 mLs (2.5 mg total) by nebulization every 2 (two) hours as needed for wheezing or shortness of breath. 10/03/16  Yes Richarda OverlieAbrol, Nayana, MD  albuterol (PROVENTIL HFA;VENTOLIN HFA) 108 (90 Base) MCG/ACT inhaler Inhale 2 puffs every 4 (four) hours as needed into the lungs for wheezing or shortness of breath. Patient not taking: Reported on 09/03/2017 08/25/17   Blane OharaZavitz, Joshua, MD  albuterol (PROVENTIL) (2.5 MG/3ML) 0.083% nebulizer solution Take 3 mLs (2.5 mg total) every 6 (six) hours as needed by nebulization for wheezing or shortness of breath. Patient not taking: Reported on 09/03/2017 08/25/17   Blane OharaZavitz, Joshua, MD  benzonatate (TESSALON) 100 MG capsule Take 1 capsule (100 mg total) by mouth every 8 (eight) hours. 09/04/17   Rise MuLeaphart, Martika Egler T, PA-C  fluticasone (FLONASE) 50 MCG/ACT nasal spray Place 2 sprays into both nostrils daily. 09/04/17   Rise MuLeaphart, Jereld Presti T, PA-C  guaiFENesin (MUCINEX) 600 MG 12 hr tablet Take 1 tablet (600 mg total) by mouth 2 (two) times daily. Patient not taking: Reported on 09/03/2017 10/03/16   Richarda OverlieAbrol, Nayana, MD  ipratropium-albuterol (DUONEB) 0.5-2.5 (3) MG/3ML SOLN Take 3 mLs by nebulization every 6 (six) hours as needed. Patient not taking: Reported on 09/03/2017 10/03/16   Richarda OverlieAbrol, Nayana, MD  predniSONE (DELTASONE) 20 MG tablet Take 2 tablets (40 mg total) by mouth daily. 09/04/17   Rise MuLeaphart, Rawson Minix T, PA-C  Spacer/Aero-Holding Chambers (AEROCHAMBER PLUS) inhaler Use as instructed Patient not taking:  Reported on 10/01/2016 07/11/16   Merlene Laughter, DO    Family History Family History  Problem Relation Age of Onset  . Vitiligo Mother     Social History Social History   Tobacco Use  . Smoking status: Former Smoker    Last attempt to quit: 07/02/2016    Years since quitting: 1.1  . Smokeless tobacco: Never Used  Substance Use Topics  . Alcohol use: No  . Drug use: No     Allergies   Patient  has no known allergies.   Review of Systems Review of Systems  Constitutional: Negative for chills and fever.  HENT: Positive for congestion, sneezing and sore throat. Negative for ear pain.   Eyes: Negative for visual disturbance.  Respiratory: Positive for wheezing. Negative for cough and shortness of breath.   Cardiovascular: Negative for chest pain.  Gastrointestinal: Negative for abdominal pain, diarrhea, nausea and vomiting.  Musculoskeletal: Negative for arthralgias and myalgias.  Skin: Negative for rash.  Neurological: Negative for dizziness, light-headedness and headaches.  Psychiatric/Behavioral: Negative for sleep disturbance. The patient is not nervous/anxious.      Physical Exam Updated Vital Signs BP 117/71   Pulse 87   Temp 97.9 F (36.6 C) (Oral)   Resp 16   Ht 5\' 7"  (1.702 m)   Wt 86.2 kg (190 lb)   SpO2 98%   BMI 29.76 kg/m   Physical Exam  Constitutional: He is oriented to person, place, and time. He appears well-developed and well-nourished.  Non-toxic appearance. No distress.  HENT:  Head: Normocephalic and atraumatic.  Right Ear: Tympanic membrane, external ear and ear canal normal.  Left Ear: Tympanic membrane, external ear and ear canal normal.  Nose: Mucosal edema and rhinorrhea present.  Mouth/Throat: Uvula is midline and mucous membranes are normal. Posterior oropharyngeal edema and posterior oropharyngeal erythema present. No oropharyngeal exudate or tonsillar abscesses. No tonsillar exudate.  Eyes: Conjunctivae are normal. Pupils are equal, round, and reactive to light. Right eye exhibits no discharge. Left eye exhibits no discharge.  Neck: Normal range of motion. Neck supple.  Cardiovascular: Normal rate, regular rhythm, normal heart sounds and intact distal pulses. Exam reveals no gallop and no friction rub.  No murmur heard. Pulmonary/Chest: Effort normal. No stridor. No respiratory distress. He has wheezes. He has no rales. He exhibits no  tenderness.  No retractions.  No increased work of breathing.  No hypoxia or tachypnea noted  Musculoskeletal: Normal range of motion. He exhibits no tenderness.  Lymphadenopathy:    He has no cervical adenopathy.  Neurological: He is alert and oriented to person, place, and time.  Skin: Skin is warm and dry. Capillary refill takes less than 2 seconds. No rash noted.  Psychiatric: His behavior is normal. Judgment and thought content normal.  Nursing note and vitals reviewed.    ED Treatments / Results  Labs (all labs ordered are listed, but only abnormal results are displayed) Labs Reviewed  RAPID STREP SCREEN (NOT AT [facility]) - Abnormal; Notable for the following components:      Result Value   Streptococcus, Group A Screen (Direct) POSITIVE (*)    All other components within normal limits    EKG  EKG Interpretation None       Radiology Dg Chest 2 View  Result Date: 09/03/2017 CLINICAL DATA:  Wheezing and shortness of breath for 2 days. History of asthma. Smoker. EXAM: CHEST  2 VIEW COMPARISON:  08/25/2017 FINDINGS: The heart size and mediastinal contours are within normal  limits. Both lungs are clear. The visualized skeletal structures are unremarkable. IMPRESSION: No active cardiopulmonary disease. Electronically Signed   By: Burman NievesWilliam  Stevens M.D.   On: 09/03/2017 21:39    Procedures Procedures (including critical care time)  Medications Ordered in ED Medications  albuterol (PROVENTIL) (2.5 MG/3ML) 0.083% nebulizer solution 5 mg (5 mg Nebulization Given 09/03/17 2057)  ipratropium-albuterol (DUONEB) 0.5-2.5 (3) MG/3ML nebulizer solution 3 mL (3 mLs Nebulization Given 09/04/17 0024)  predniSONE (DELTASONE) tablet 40 mg (40 mg Oral Given 09/04/17 0025)  HYDROcodone-homatropine (HYCODAN) 5-1.5 MG/5ML syrup 5 mL (5 mLs Oral Given 09/04/17 0030)  ketorolac (TORADOL) 30 MG/ML injection 15 mg (15 mg Intramuscular Given 09/04/17 0024)  penicillin g benzathine (BICILLIN LA)  1200000 UNIT/2ML injection 1.2 Million Units (1.2 Million Units Intramuscular Given 09/04/17 0024)     Initial Impression / Assessment and Plan / ED Course  I have reviewed the triage vital signs and the nursing notes.  Pertinent labs & imaging results that were available during my care of the patient were reviewed by me and considered in my medical decision making (see chart for details).     Patient ambulated in ED with O2 saturations maintained >90, no current signs of respiratory distress. Lung exam improved after nebulizer treatment. Prednisone given in the ED and pt will bd dc with 5 day burst. Pt states they are breathing at baseline.   Pt afebrile with sore throat and positive strep test. Treated in the Ed with steroids, NSAIDs, Pain medication and PCN IM.  Pt appears mildly dehydrated, discussed importance of water rehydration. Presentation non concerning for PTA or infxn spread to soft tissue. No trismus or uvula deviation. Specific return precautions discussed. Pt able to drink water in ED without difficulty with intact air way.   Pt is hemodynamically stable, in NAD, & able to ambulate in the ED. Evaluation does not show pathology that would require ongoing emergent intervention or inpatient treatment. I explained the diagnosis to the patient. Pain has been managed & has no complaints prior to dc. Pt is comfortable with above plan and is stable for discharge at this time. All questions were answered prior to disposition. Strict return precautions for f/u to the ED were discussed. Encouraged follow up with PCP.     Final Clinical Impressions(s) / ED Diagnoses   Final diagnoses:  Strep throat  Mild asthma with exacerbation, unspecified whether persistent    ED Discharge Orders        Ordered    predniSONE (DELTASONE) 20 MG tablet  Daily     09/04/17 0154    benzonatate (TESSALON) 100 MG capsule  Every 8 hours     09/04/17 0154    fluticasone (FLONASE) 50 MCG/ACT nasal spray   Daily     09/04/17 0154       Rise MuLeaphart, Mickie Badders T, PA-C 09/04/17 0202    Devoria AlbeKnapp, Iva, MD 09/04/17 [phone]

## 2017-09-04 NOTE — Discharge Instructions (Signed)
Been treated for strep throat in the ED.  I have also give you prednisone to take the next 4 days for your asthma.  Continue using your inhaler and nebulizer.  Have given you Tessalon for cough.  Use the Flonase for nasal congestion.  Motrin and Tylenol for pain at home.  Follow-up with the primary care doctor.  Return to the ED if you develop any worsening shortness of breath, or for any new symptoms.

## 2017-09-04 NOTE — ED Notes (Signed)
Pt ambulated without assistance. For 40 paces maintained 99-100% saturation. Steady gait. Felt strong.

## 2017-09-22 ENCOUNTER — Emergency Department (HOSPITAL_COMMUNITY): Payer: Self-pay

## 2017-09-22 ENCOUNTER — Emergency Department (HOSPITAL_COMMUNITY)
Admission: EM | Admit: 2017-09-22 | Discharge: 2017-09-22 | Disposition: A | Discharge status: Home / Self Care | Payer: Self-pay | Attending: Emergency Medicine | Admitting: Emergency Medicine

## 2017-09-22 ENCOUNTER — Encounter (HOSPITAL_COMMUNITY): Payer: Self-pay | Admitting: Emergency Medicine

## 2017-09-22 ENCOUNTER — Other Ambulatory Visit: Payer: Self-pay

## 2017-09-22 DIAGNOSIS — Z87891 Personal history of nicotine dependence: Secondary | ICD-10-CM | POA: Insufficient documentation

## 2017-09-22 DIAGNOSIS — Z79899 Other long term (current) drug therapy: Secondary | ICD-10-CM | POA: Insufficient documentation

## 2017-09-22 DIAGNOSIS — J45901 Unspecified asthma with (acute) exacerbation: Secondary | ICD-10-CM | POA: Insufficient documentation

## 2017-09-22 MED ORDER — IPRATROPIUM-ALBUTEROL 0.5-2.5 (3) MG/3ML IN SOLN
3.0000 mL | Freq: Once | RESPIRATORY_TRACT | Status: AC
  Administered 2017-09-22: 3 mL via RESPIRATORY_TRACT
  Filled 2017-09-22: qty 3

## 2017-09-22 MED ORDER — ALBUTEROL SULFATE (2.5 MG/3ML) 0.083% IN NEBU: 2.5000 mg | INHALATION_SOLUTION | Freq: Four times a day (QID) | RESPIRATORY_TRACT | 0 refills | Status: DC | PRN

## 2017-09-22 MED ORDER — ALBUTEROL SULFATE (2.5 MG/3ML) 0.083% IN NEBU
5.0000 mg | INHALATION_SOLUTION | Freq: Once | RESPIRATORY_TRACT | Status: AC
  Administered 2017-09-22: 5 mg via RESPIRATORY_TRACT
  Filled 2017-09-22: qty 6

## 2017-09-22 MED ORDER — PREDNISONE 20 MG PO TABS: 40.0000 mg | ORAL_TABLET | Freq: Every day | ORAL | 0 refills | Status: DC

## 2017-09-22 MED ORDER — PREDNISONE 20 MG PO TABS
60.0000 mg | ORAL_TABLET | Freq: Once | ORAL | Status: AC
  Administered 2017-09-22: 60 mg via ORAL
  Filled 2017-09-22: qty 3

## 2017-09-22 NOTE — ED Provider Notes (Signed)
[facility] EMERGENCY [facility]PARTMENT Provider Note   CSN: [id number] Arrival date & time: 09/22/17  1951     History   Chief Complaint Chief Complaint  Patient presents with  . Shortness of Breath    HPI Ian Terry (pronounced Sherilyn DacostaKa-Ron) is a 31 y.o. male who presents with shortness of breath and wheezing.  Past medical history significant for asthma, right eye blindness.  Patient states that he is had shortness of breath cough and wheezing for the past week which is consistent with prior asthma exacerbations.  He has been using his albuterol inhaler and nebulizer with good relief however as soon as he goes outside his symptoms return.  He does not currently have a primary care provider.  He understands that his asthma is not adequately controlled and knows he needs to get one.  He reports that he has recovered from strep throat which she had a couple weeks ago.  He denies any fever, URI symptoms currently.  He has tried Symbicort in the past which controlled his symptoms well.  HPI  Past Medical History:  Diagnosis Date  . Asthma   . Legal blindness    right eye only     Patient Active Problem List   Diagnosis Date Noted  . Moderate asthma with exacerbation   . 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 Asthma exacerbation 07/09/2016  . Sepsis (HCC) 07/09/2016  . Streptococcal sore throat 07/09/2016  . PNEUMONIA, RIGHT LOWER LOBE 02/01/2008  . ASTHMA 02/01/2008    Past Surgical History:  Procedure Laterality Date  . EYE SURGERY         Home Medications    Prior to Admission medications   Medication Sig Start Date End Date Taking? Authorizing Provider  albuterol (PROVENTIL HFA;VENTOLIN HFA) 108 (90 Base) MCG/ACT inhaler Inhale 2 puffs into the lungs every 6 (six) hours as needed for wheezing or shortness of breath. 09/21/16   Mabe, Latanya MaudlinMartha L, MD  albuterol (PROVENTIL HFA;VENTOLIN HFA) 108 (90 Base) MCG/ACT inhaler Inhale 2 puffs every 4 (four) hours as needed into  the lungs for wheezing or shortness of breath. Patient not taking: Reported on 09/03/2017 08/25/17   Blane OharaZavitz, Joshua, MD  albuterol (PROVENTIL) (2.5 MG/3ML) 0.083% nebulizer solution Take 3 mLs (2.5 mg total) by nebulization every 2 (two) hours as needed for wheezing or shortness of breath. 10/03/16   Richarda OverlieAbrol, Nayana, MD  albuterol (PROVENTIL) (2.5 MG/3ML) 0.083% nebulizer solution Take 3 mLs (2.5 mg total) every 6 (six) hours as needed by nebulization for wheezing or shortness of breath. Patient not taking: Reported on 09/03/2017 08/25/17   Blane OharaZavitz, Joshua, MD  benzonatate (TESSALON) 100 MG capsule Take 1 capsule (100 mg total) by mouth every 8 (eight) hours. 09/04/17   Rise MuLeaphart, Kenneth T, PA-C  fluticasone (FLONASE) 50 MCG/ACT nasal spray Place 2 sprays into both nostrils daily. 09/04/17   Rise MuLeaphart, Kenneth T, PA-C  guaiFENesin (MUCINEX) 600 MG 12 hr tablet Take 1 tablet (600 mg total) by mouth 2 (two) times daily. Patient not taking: Reported on 09/03/2017 10/03/16   Richarda OverlieAbrol, Nayana, MD  ipratropium-albuterol (DUONEB) 0.5-2.5 (3) MG/3ML SOLN Take 3 mLs by nebulization every 6 (six) hours as needed. Patient not taking: Reported on 09/03/2017 10/03/16   Richarda OverlieAbrol, Nayana, MD  predniSONE (DELTASONE) 20 MG tablet Take 2 tablets (40 mg total) by mouth daily. 09/04/17   Rise MuLeaphart, Kenneth T, PA-C  Spacer/Aero-Holding Chambers (AEROCHAMBER PLUS) inhaler Use as instructed Patient not taking: Reported on 10/01/2016 07/11/16   Marguerita MerlesSheikh, Omair  Latif, DO    Family History Family History  Problem Relation Age of Onset  . Vitiligo Mother     Social History Social History   Tobacco Use  . Smoking status: Former Smoker    Last attempt to quit: 07/02/2016    Years since quitting: 1.2  . Smokeless tobacco: Never Used  Substance Use Topics  . Alcohol use: No  . Drug use: No     Allergies   Patient has no known allergies.   Review of Systems Review of Systems  Constitutional: Negative for chills and  fever.  HENT: Negative for congestion, rhinorrhea and sore throat.   Respiratory: Positive for cough, shortness of breath and wheezing.   Cardiovascular: Negative for chest pain.  All other systems reviewed and are negative.    Physical Exam Updated Vital Signs BP 123/75 (BP Location: Right Arm)   Pulse 80   Temp 98.8 F (37.1 C) (Oral)   Resp 20   Ht 5\' 7"  (1.702 m)   Wt 81.6 kg (180 lb)   SpO2 96%   BMI 28.19 kg/m   Physical Exam  Constitutional: He is oriented to person, place, and time. He appears well-developed and well-nourished. No distress.  HENT:  Head: Normocephalic and atraumatic.  Right Ear: Hearing, tympanic membrane, external ear and ear canal normal.  Left Ear: Hearing, tympanic membrane, external ear and ear canal normal.  Nose: Nose normal.  Mouth/Throat: Uvula is midline, oropharynx is clear and moist and mucous membranes are normal.  Eyes: Conjunctivae are normal. Pupils are equal, round, and reactive to light. Right eye exhibits no discharge. Left eye exhibits no discharge. No scleral icterus.  Neck: Normal range of motion.  Cardiovascular: Normal rate and regular rhythm. Exam reveals no gallop and no friction rub.  No murmur heard. Pulmonary/Chest: Effort normal. No stridor. No respiratory distress. He has wheezes (Expiratory wheezes in all lung fields). He has no rales. He exhibits no tenderness.  Abdominal: He exhibits no distension.  Neurological: He is alert and oriented to person, place, and time.  Skin: Skin is warm and dry.  Psychiatric: He has a normal mood and affect. His behavior is normal.  Nursing note and vitals reviewed.    ED Treatments / Results  Labs (all labs ordered are listed, but only abnormal results are displayed) Labs Reviewed - No data to display  EKG  EKG Interpretation None       Radiology Dg Chest 2 View  Result Date: 09/22/2017 CLINICAL DATA:  Dyspnea for 1 week, no relief from home nebulizer EXAM: CHEST  2  VIEW COMPARISON:  09/03/2017 FINDINGS: The lungs are clear. The pulmonary vasculature is normal. Heart size is normal. Hilar and mediastinal contours are unremarkable. There is no pleural effusion. IMPRESSION: No active cardiopulmonary disease. Electronically Signed   By: Ellery Plunk M.D.   On: 09/22/2017 20:38    Procedures Procedures (including critical care time)  Medications Ordered in ED Medications  albuterol (PROVENTIL) (2.5 MG/3ML) 0.083% nebulizer solution 5 mg (5 mg Nebulization Given 09/22/17 2003)  ipratropium-albuterol (DUONEB) 0.5-2.5 (3) MG/3ML nebulizer solution 3 mL (3 mLs Nebulization Given 09/22/17 2058)  predniSONE (DELTASONE) tablet 60 mg (60 mg Oral Given 09/22/17 2058)  ipratropium-albuterol (DUONEB) 0.5-2.5 (3) MG/3ML nebulizer solution 3 mL (3 mLs Nebulization Given 09/22/17 2204)     Initial Impression / Assessment and Plan / ED Course  I have reviewed the triage vital signs and the nursing notes.  Pertinent labs & imaging results that were  available during my care of the patient were reviewed by me and considered in my medical decision making (see chart for details).  31 year old male presents with SOB, wheezing, cough due to asthma exacerbation. Vitals are normal. He received a breathing tx in triage and on exam he is still wheezing. Prednisone and duoneb were ordered. CXR is negative. Will reassess after 2nd treatment.  After 2nd treatment he still has expiratory wheezing at the bases. 2nd Duoneb ordered.  After 3rd treatment he feels better. He has very mild wheezes. He is worried symptoms will worsen upon discharge. I advised [facility] so he can get better control of his asthma. He agrees and will make appointment. He was given rx for Prednisone and refill for albuterol for his nebulizer. Return precautions given.  Final Clinical Impressions(s) / ED Diagnoses   Final diagnoses:  Mild asthma with exacerbation,  unspecified whether persistent    ED Discharge Orders    None       Bethel BornGekas, Kelly Marie, PA-C 09/23/17 0017    Raeford RazorKohut, Stephen, MD 09/23/17 1930

## 2017-09-22 NOTE — Discharge Instructions (Signed)
Please call [facility] to establish care Take Prednisone for the next 5 days Return if worsening

## 2017-09-22 NOTE — ED Triage Notes (Signed)
Pt c/o shortness of breath, cough and wheezing x 1 week. States he has been using albuterol without relief. Wheezing heard in bilateral lobes.

## 2017-09-22 NOTE — ED Notes (Signed)
Pt verbalized understanding of d/c instructions and has no further questions. Pt is stable, A&Ox4, VSS.  

## 2017-10-04 ENCOUNTER — Emergency Department (HOSPITAL_COMMUNITY)
Admission: EM | Admit: 2017-10-04 | Discharge: 2017-10-04 | Disposition: A | Discharge status: Home / Self Care | Payer: Self-pay | Attending: Emergency Medicine | Admitting: Emergency Medicine

## 2017-10-04 ENCOUNTER — Encounter (HOSPITAL_COMMUNITY): Payer: Self-pay | Admitting: Emergency Medicine

## 2017-10-04 DIAGNOSIS — J45901 Unspecified asthma with (acute) exacerbation: Secondary | ICD-10-CM | POA: Insufficient documentation

## 2017-10-04 DIAGNOSIS — Z87891 Personal history of nicotine dependence: Secondary | ICD-10-CM | POA: Insufficient documentation

## 2017-10-04 MED ORDER — ALBUTEROL SULFATE HFA 108 (90 BASE) MCG/ACT IN AERS: 2.0000 | INHALATION_SPRAY | RESPIRATORY_TRACT | Status: DC | PRN

## 2017-10-04 MED ORDER — ALBUTEROL SULFATE (2.5 MG/3ML) 0.083% IN NEBU: 2.5000 mg | INHALATION_SOLUTION | Freq: Four times a day (QID) | RESPIRATORY_TRACT | 0 refills | Status: DC | PRN

## 2017-10-04 MED ORDER — PREDNISONE 20 MG PO TABS: 40.0000 mg | ORAL_TABLET | Freq: Every day | ORAL | 0 refills | Status: DC

## 2017-10-04 MED ORDER — IPRATROPIUM BROMIDE 0.02 % IN SOLN
0.5000 mg | Freq: Once | RESPIRATORY_TRACT | Status: AC
  Administered 2017-10-04: 0.5 mg via RESPIRATORY_TRACT
  Filled 2017-10-04: qty 2.5

## 2017-10-04 MED ORDER — ALBUTEROL SULFATE (2.5 MG/3ML) 0.083% IN NEBU
5.0000 mg | INHALATION_SOLUTION | Freq: Once | RESPIRATORY_TRACT | Status: AC
  Administered 2017-10-04: 5 mg via RESPIRATORY_TRACT
  Filled 2017-10-04: qty 6

## 2017-10-04 NOTE — ED Triage Notes (Signed)
Pt arrives via EMS from home with sudden SOB and wheezing. Wheezing in all fields. 5 albuterol and 0.5 atrovent, and 125 solu-medrol given by EMS. Hx of asthma. 108/72, HR 78 NSR, RR 20 non labored.

## 2017-10-04 NOTE — Discharge Instructions (Signed)
Find a primary care doctor to follow-up with your asthma.

## 2017-10-04 NOTE — ED Notes (Signed)
Patient given discharge instructions and verbalized understanding.  Patient stable to discharge at this time.  Patient is alert and oriented to baseline.  No distressed noted at this time.  All belongings taken with the patient at discharge.   

## 2017-10-04 NOTE — ED Provider Notes (Signed)
[facility] Provider Note   CSN: [id number] Arrival date & time: 10/04/17  1330     History   Chief Complaint Chief Complaint  Patient presents with  . Shortness of Breath    HPI Ian Terry is a 31 y.o. male.  HPI Patient begins with shortness of breath.  Has had it for the last week.  History of asthma.  Has inhaler but not on other medicines.  Has been seen frequently in the ER for it but does not have a primary care doctor that he sees.  States his last visit they were supposed to arrange follow-up it was not done.  Has had a little bit of sinus congestion.  No fevers.  Came in by EMS and had breathing treatment with them.  States he feels somewhat better but still feels tight.  No abdominal pain.  No sick contacts.  No swelling in his legs. Past Medical History:  Diagnosis Date  . Asthma   . Legal blindness    right eye only     Patient Active Problem List   Diagnosis Date Noted  . Moderate asthma with exacerbation   . 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 Asthma exacerbation 07/09/2016  . Sepsis (HCC) 07/09/2016  . Streptococcal sore throat 07/09/2016  . PNEUMONIA, RIGHT LOWER LOBE 02/01/2008  . ASTHMA 02/01/2008    Past Surgical History:  Procedure Laterality Date  . EYE SURGERY         Home Medications    Prior to Admission medications   Medication Sig Start Date End Date Taking? Authorizing Provider  albuterol (PROVENTIL HFA;VENTOLIN HFA) 108 (90 Base) MCG/ACT inhaler Inhale 2 puffs into the lungs every 6 (six) hours as needed for wheezing or shortness of breath. 09/21/16  Yes Mabe, Latanya MaudlinMartha L, MD  fluticasone (FLONASE) 50 MCG/ACT nasal spray Place 2 sprays into both nostrils daily. 09/04/17  Yes Leaphart, Iantha FallenKenneth T, PA-C  albuterol (PROVENTIL) (2.5 MG/3ML) 0.083% nebulizer solution Take 3 mLs (2.5 mg total) by nebulization every 6 (six) hours as needed for wheezing or shortness of breath. 10/04/17   Benjiman CorePickering, Dorla Guizar, MD    predniSONE (DELTASONE) 20 MG tablet Take 2 tablets (40 mg total) by mouth daily. 10/04/17   Benjiman CorePickering, Norma Ignasiak, MD    Family History Family History  Problem Relation Age of Onset  . Vitiligo Mother     Social History Social History   Tobacco Use  . Smoking status: Former Smoker    Last attempt to quit: 07/02/2016    Years since quitting: 1.2  . Smokeless tobacco: Never Used  Substance Use Topics  . Alcohol use: No  . Drug use: No     Allergies   Patient has no known allergies.   Review of Systems Review of Systems  Constitutional: Negative for appetite change.  HENT: Positive for congestion.   Eyes: Negative for photophobia.  Respiratory: Positive for cough and shortness of breath.   Cardiovascular: Positive for chest pain.  Gastrointestinal: Negative for abdominal pain.  Endocrine: Negative for polyuria.  Genitourinary: Negative for flank pain.  Musculoskeletal: Negative for back pain.  Neurological: Negative for numbness.  Hematological: Negative for adenopathy.  Psychiatric/Behavioral: Negative for confusion.     Physical Exam Updated Vital Signs BP 114/77 (BP Location: Right Arm)   Pulse 95   Temp 98.2 F (36.8 C) (Oral)   Resp 18   SpO2 98%   Physical Exam  Constitutional: He appears well-developed.  HENT:  Head: Atraumatic.  Cardiovascular:  Mild tachycardia  Pulmonary/Chest: Effort normal. No respiratory distress.  Diffuse wheezes and prolonged expirations without rales or rhonchi.  Abdominal: Soft.  Musculoskeletal:       Right lower leg: He exhibits no edema.       Left lower leg: He exhibits no edema.  Neurological: He is alert.  Skin: Skin is warm. Capillary refill takes less than 2 seconds.  Psychiatric: He has a normal mood and affect.     ED Treatments / Results  Labs (all labs ordered are listed, but only abnormal results are displayed) Labs Reviewed - No data to display  EKG  EKG Interpretation None       Radiology No  results found.  Procedures Procedures (including critical care time)  Medications Ordered in ED Medications  albuterol (PROVENTIL HFA;VENTOLIN HFA) 108 (90 Base) MCG/ACT inhaler 2 puff (not administered)  albuterol (PROVENTIL) (2.5 MG/3ML) 0.083% nebulizer solution 5 mg (5 mg Nebulization Given 10/04/17 1347)  ipratropium (ATROVENT) nebulizer solution 0.5 mg (0.5 mg Nebulization Given 10/04/17 1347)     Initial Impression / Assessment and Plan / ED Course  I have reviewed the triage vital signs and the nursing notes.  Pertinent labs & imaging results that were available during my care of the patient were reviewed by me and considered in my medical decision making (see chart for details).     Patient with shortness of breath.  History of asthma and is poorly controlled and does not follow-up.  Feels better after treatment.  Discharge home with medications.  Instructed on need to follow with a PCP.  Final Clinical Impressions(s) / ED Diagnoses   Final diagnoses:  Exacerbation of asthma, unspecified asthma severity, unspecified whether persistent    ED Discharge Orders        Ordered    predniSONE (DELTASONE) 20 MG tablet  Daily     10/04/17 1508    albuterol (PROVENTIL) (2.5 MG/3ML) 0.083% nebulizer solution  Every 6 hours PRN     10/04/17 1508       Benjiman CorePickering, Rashan Patient, MD 10/04/17 1542

## 2019-05-14 ENCOUNTER — Emergency Department (HOSPITAL_COMMUNITY)
Admission: EM | Admit: 2019-05-14 | Discharge: 2019-05-14 | Disposition: A | Discharge status: Home / Self Care | Payer: Self-pay | Attending: Emergency Medicine | Admitting: Emergency Medicine

## 2019-05-14 ENCOUNTER — Other Ambulatory Visit: Payer: Self-pay

## 2019-05-14 ENCOUNTER — Encounter (HOSPITAL_COMMUNITY): Payer: Self-pay

## 2019-05-14 DIAGNOSIS — Z79899 Other long term (current) drug therapy: Secondary | ICD-10-CM | POA: Insufficient documentation

## 2019-05-14 DIAGNOSIS — J45901 Unspecified asthma with (acute) exacerbation: Secondary | ICD-10-CM | POA: Insufficient documentation

## 2019-05-14 DIAGNOSIS — Z20828 Contact with and (suspected) exposure to other viral communicable diseases: Secondary | ICD-10-CM | POA: Insufficient documentation

## 2019-05-14 DIAGNOSIS — Z87891 Personal history of nicotine dependence: Secondary | ICD-10-CM | POA: Insufficient documentation

## 2019-05-14 LAB — SARS CORONAVIRUS 2 BY RT PCR (HOSPITAL ORDER, PERFORMED IN ~~LOC~~ HOSPITAL LAB): SARS Coronavirus 2: NEGATIVE

## 2019-05-14 MED ORDER — METHYLPREDNISOLONE SODIUM SUCC 125 MG IJ SOLR
125.0000 mg | Freq: Once | INTRAMUSCULAR | Status: AC
  Administered 2019-05-14: 125 mg via INTRAVENOUS
  Filled 2019-05-14: qty 2

## 2019-05-14 MED ORDER — ALBUTEROL SULFATE HFA 108 (90 BASE) MCG/ACT IN AERS
8.0000 | INHALATION_SPRAY | Freq: Once | RESPIRATORY_TRACT | Status: AC
  Administered 2019-05-14: 8 via RESPIRATORY_TRACT
  Filled 2019-05-14: qty 6.7

## 2019-05-14 MED ORDER — AEROCHAMBER PLUS FLO-VU LARGE MISC
Status: AC
  Administered 2019-05-14: 1
  Filled 2019-05-14: qty 1

## 2019-05-14 MED ORDER — PREDNISONE 20 MG PO TABS: 40.0000 mg | ORAL_TABLET | Freq: Every day | ORAL | 0 refills | Status: AC

## 2019-05-14 MED ORDER — IPRATROPIUM BROMIDE HFA 17 MCG/ACT IN AERS
2.0000 | INHALATION_SPRAY | Freq: Once | RESPIRATORY_TRACT | Status: AC
  Administered 2019-05-14: 21:00:00 2 via RESPIRATORY_TRACT
  Filled 2019-05-14: qty 12.9

## 2019-05-14 NOTE — ED Provider Notes (Signed)
[place] EMERGENCY DEPARTMENT Provider Note   CSN: [id number] Arrival date & time: 05/14/19  1851     History   Chief Complaint Chief Complaint  Patient presents with  . Asthma    HPI Ian Terry is a 33 y.o. male presenting for evaluation of sob and chest tightness.   Pt states approximately 2 hrs PTA he woke up with chest tightness and sob. It feels like his asthma exacerbation. It did not improve with his home albuterol (but he cannot tell me how many puffs he took since his sxs began). Pt states he has used his inhaler ~6 times today. He denies fevers, chills, cough, st, nasal congestion, n/v, abd pain. He denies sick contacts or contact with known covid-19 positive person. He has no other medical problems, takes no medications other than albuterol daily.      HPI  Past Medical History:  Diagnosis Date  . Asthma   . Legal blindness    right eye only     Patient Active Problem List   Diagnosis Date Noted  . Moderate asthma with exacerbation   . 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 Asthma exacerbation 07/09/2016  . Sepsis ([place]) 07/09/2016  . Streptococcal sore throat 07/09/2016  . PNEUMONIA, RIGHT LOWER LOBE 02/01/2008  . ASTHMA 02/01/2008    Past Surgical History:  Procedure Laterality Date  . EYE SURGERY          Home Medications    Prior to Admission medications   Medication Sig Start Date End Date Taking? Authorizing Provider  albuterol (PROVENTIL HFA;VENTOLIN HFA) 108 (90 Base) MCG/ACT inhaler Inhale 2 puffs into the lungs every 6 (six) hours as needed for wheezing or shortness of breath. 09/21/16   Mabe, Forbes Cellar, MD  albuterol (PROVENTIL) (2.5 MG/3ML) 0.083% nebulizer solution Take 3 mLs (2.5 mg total) by nebulization every 6 (six) hours as needed for wheezing or shortness of breath. 10/04/17   Davonna Belling, MD  fluticasone (FLONASE) 50 MCG/ACT nasal spray Place 2 sprays into both nostrils daily. 09/04/17   Doristine Devoid, PA-C   predniSONE (DELTASONE) 20 MG tablet Take 2 tablets (40 mg total) by mouth daily for 5 days. 05/14/19 05/19/19  Nazair Fortenberry, PA-C    Family History Family History  Problem Relation Age of Onset  . Vitiligo Mother     Social History Social History   Tobacco Use  . Smoking status: Former Smoker    Quit date: 07/02/2016    Years since quitting: 2.8  . Smokeless tobacco: Never Used  Substance Use Topics  . Alcohol use: No  . Drug use: No     Allergies   Patient has no known allergies.   Review of Systems Review of Systems  Respiratory: Positive for chest tightness, shortness of breath and wheezing.   All other systems reviewed and are negative.    Physical Exam Updated Vital Signs BP (!) 131/93   Pulse 71   Temp 98.3 F (36.8 C) (Oral)   Resp 18   SpO2 96%   Physical Exam Vitals signs and nursing note reviewed.  Constitutional:      General: He is not in acute distress.    Appearance: He is well-developed.     Comments: Appears nontoxic  HENT:     Head: Normocephalic and atraumatic.  Eyes:     Extraocular Movements: Extraocular movements intact.     Conjunctiva/sclera: Conjunctivae normal.     Pupils: Pupils are equal, round, and reactive to  light.  Neck:     Musculoskeletal: Normal range of motion and neck supple.  Cardiovascular:     Rate and Rhythm: Normal rate and regular rhythm.  Pulmonary:     Effort: Pulmonary effort is normal. No respiratory distress.     Breath sounds: Wheezing present.     Comments: Speaking in full sentences. Slightly tachypneic ~30. expiratory wheezes in all fields.  Abdominal:     General: There is no distension.     Palpations: Abdomen is soft. There is no mass.     Tenderness: There is no abdominal tenderness. There is no guarding or rebound.  Musculoskeletal: Normal range of motion.  Skin:    General: Skin is warm and dry.     Capillary Refill: Capillary refill takes less than 2 seconds.  Neurological:     Mental  Status: He is alert and oriented to person, place, and time.      ED Treatments / Results  Labs (all labs ordered are listed, but only abnormal results are displayed) Labs Reviewed  SARS CORONAVIRUS 2 (HOSPITAL ORDER[facility])    EKG None  Radiology No results found.  Procedures Procedures (including critical care time)  Medications Ordered in ED Medications  methylPREDNISolone sodium succinate (SOLU-MEDROL) 125 mg/2 mL injection 125 mg (125 mg Intravenous Given 05/14/19 2015)  albuterol (VENTOLIN HFA) 108 (90 Base) MCG/ACT inhaler 8 puff (8 puffs Inhalation Given 05/14/19 1928)  ipratropium (ATROVENT HFA) inhaler 2 puff (2 puffs Inhalation Given 05/14/19 2036)  AeroChamber Plus Flo-Vu Large MISC (1 each  Given 05/14/19 1928)     Initial Impression / Assessment and Plan / ED Course  I have reviewed the triage vital signs and the nursing notes.  Pertinent labs & imaging results that were available during my care of the patient were reviewed by me and considered in my medical decision making (see chart for details).        Patient presenting for evaluation of shortness of breath and chest tightness.  Physical exam shows wheezing in all fields and slight tachypnea.  However, no respiratory distress, sats stable.  Will give albuterol and ipratropium inhalers.  Rapid COVID ordered in case patient does not improve with inhalers, may need neb treatment and/or admission.  Solu-Medrol given.  No fever or cough, low suspicion for pneumonia.  I do not believe x-ray would be beneficial.  On reassessment, patient reports symptoms resolved with inhalers.  Wheezing resolved.  Patient ambulatory out difficulty.  No further tachypnea.  Will discharge patient on prednisone.  COVID negative.  At this time, patient appears safe for discharge.  Return precautions given.  Patient states he understands and agrees to plan.  Final Clinical Impressions(s) / ED Diagnoses    Final diagnoses:  Exacerbation of asthma, unspecified asthma severity, unspecified whether persistent    ED Discharge Orders         Ordered    predniSONE (DELTASONE) 20 MG tablet  Daily     05/14/19 2110           Alveria ApleyCaccavale, Danille Oppedisano, PA-C 05/14/19 2252    Azalia Bilisampos, Kevin, MD 05/15/19 2243

## 2019-05-14 NOTE — ED Triage Notes (Signed)
Pt c.o asthma exacerbation without relief of home inhaler. Denies fever or cough, no audible wheezing noted in triage, resp e/u.

## 2019-05-14 NOTE — Discharge Instructions (Signed)
Take prednisone as prescribed. Use your inhaler as needed for asthma exacerbation, wheezing, chest tightness. You were tested for coronavirus.  The results have not yet returned.  If positive, you will receive a phone call.  If negative you will not. Return to the emergency room if you develop fevers, difficulty breathing, persistent chest tightness despite medication, any new, worsening, or concerning symptoms.

## 2019-05-22 ENCOUNTER — Emergency Department (HOSPITAL_COMMUNITY): Payer: Medicaid - Out of State

## 2019-05-22 ENCOUNTER — Other Ambulatory Visit: Payer: Self-pay

## 2019-05-22 ENCOUNTER — Emergency Department (HOSPITAL_COMMUNITY)
Admission: EM | Admit: 2019-05-22 | Discharge: 2019-05-22 | Disposition: A | Discharge status: Home / Self Care | Payer: Medicaid - Out of State | Attending: Emergency Medicine | Admitting: Emergency Medicine

## 2019-05-22 ENCOUNTER — Encounter (HOSPITAL_COMMUNITY): Payer: Self-pay | Admitting: Emergency Medicine

## 2019-05-22 DIAGNOSIS — Z79899 Other long term (current) drug therapy: Secondary | ICD-10-CM | POA: Diagnosis not present

## 2019-05-22 DIAGNOSIS — Z20828 Contact with and (suspected) exposure to other viral communicable diseases: Secondary | ICD-10-CM | POA: Insufficient documentation

## 2019-05-22 DIAGNOSIS — J45901 Unspecified asthma with (acute) exacerbation: Secondary | ICD-10-CM | POA: Insufficient documentation

## 2019-05-22 DIAGNOSIS — R0602 Shortness of breath: Secondary | ICD-10-CM | POA: Diagnosis present

## 2019-05-22 LAB — BASIC METABOLIC PANEL
Anion gap: 10 (ref 5–15)
BUN: 17 mg/dL (ref 6–20)
CO2: 26 mmol/L (ref 22–32)
Calcium: 9 mg/dL (ref 8.9–10.3)
Chloride: 104 mmol/L (ref 98–111)
Creatinine, Ser: 1.03 mg/dL (ref 0.61–1.24)
GFR calc Af Amer: >60 mL/min (ref >60)
GFR calc non Af Amer: >60 mL/min (ref >60)
Glucose, Bld: 112 mg/dL — ABNORMAL HIGH (ref 70–99)
Potassium: 4 mmol/L (ref 3.5–5.1)
Sodium: 140 mmol/L (ref 135–145)

## 2019-05-22 LAB — CBC WITH DIFFERENTIAL/PLATELET
Abs Immature Granulocytes: 0.05 10*3/uL (ref 0.00–0.07)
Basophils Absolute: 0.1 10*3/uL (ref 0.0–0.1)
Basophils Relative: 1 %
Eosinophils Absolute: 0.6 10*3/uL — ABNORMAL HIGH (ref 0.0–0.5)
Eosinophils Relative: 6 %
HCT: 47 % (ref 39.0–52.0)
Hemoglobin: 15.8 g/dL (ref 13.0–17.0)
Immature Granulocytes: 1 %
Lymphocytes Relative: 49 %
Lymphs Abs: 5 10*3/uL — ABNORMAL HIGH (ref 0.7–4.0)
MCH: 30.1 pg (ref 26.0–34.0)
MCHC: 33.6 g/dL (ref 30.0–36.0)
MCV: 89.5 fL (ref 80.0–100.0)
Monocytes Absolute: 0.8 10*3/uL (ref 0.1–1.0)
Monocytes Relative: 8 %
Neutro Abs: 3.4 10*3/uL (ref 1.7–7.7)
Neutrophils Relative %: 35 %
Platelets: 343 10*3/uL (ref 150–400)
RBC: 5.25 MIL/uL (ref 4.22–5.81)
RDW: 11.9 % (ref 11.5–15.5)
WBC: 10 10*3/uL (ref 4.0–10.5)
nRBC: 0 % (ref 0.0–0.2)

## 2019-05-22 LAB — SARS CORONAVIRUS 2 BY RT PCR (HOSPITAL ORDER, PERFORMED IN ~~LOC~~ HOSPITAL LAB): SARS Coronavirus 2: NEGATIVE

## 2019-05-22 MED ORDER — ALBUTEROL SULFATE HFA 108 (90 BASE) MCG/ACT IN AERS
6.0000 | INHALATION_SPRAY | Freq: Once | RESPIRATORY_TRACT | Status: AC
  Administered 2019-05-22: 18:00:00 6 via RESPIRATORY_TRACT
  Filled 2019-05-22: qty 6.7

## 2019-05-22 MED ORDER — METHYLPREDNISOLONE SODIUM SUCC 125 MG IJ SOLR
125.0000 mg | Freq: Once | INTRAMUSCULAR | Status: AC
  Administered 2019-05-22: 19:00:00 125 mg via INTRAVENOUS
  Filled 2019-05-22: qty 2

## 2019-05-22 MED ORDER — PREDNISONE 50 MG PO TABS: 50.0000 mg | ORAL_TABLET | Freq: Every day | ORAL | 0 refills | Status: DC

## 2019-05-22 MED ORDER — ALBUTEROL SULFATE (2.5 MG/3ML) 0.083% IN NEBU: 2.5000 mg | INHALATION_SOLUTION | Freq: Four times a day (QID) | RESPIRATORY_TRACT | 0 refills | Status: DC | PRN

## 2019-05-22 MED ORDER — MAGNESIUM SULFATE 2 GM/50ML IV SOLN
2.0000 g | Freq: Once | INTRAVENOUS | Status: AC
  Administered 2019-05-22: 2 g via INTRAVENOUS
  Filled 2019-05-22: qty 50

## 2019-05-22 NOTE — ED Provider Notes (Signed)
33 year old male presents with an asthma exacerbation. Care signed out from previous provider pending re-eval after tx. Labs are normal. CXR is negative. COVID is negative  Rechecked pt. Lungs are clear. He feels much better. He is satting at 94% on RA. He wants to go home which I feel is reasonable. He is requesting a refill of his albuterol solution and will give rx for another steroid burst. He states he will be in town for another week and then will go back to [facility].   Recardo Evangelist, PA-C 05/22/19 2035    Davonna Belling, MD 05/22/19 [phone]

## 2019-05-22 NOTE — ED Triage Notes (Signed)
Pt reports exacerbation of asthma. He reports using his home inhaler 7-8 times today with no relief. Pt has audible wheezing in triage.

## 2019-05-22 NOTE — ED Provider Notes (Signed)
[facility]   CSN: [id number] Arrival date & time: 05/22/19  1629    History   Chief Complaint Chief Complaint  Patient presents with  . Shortness of Breath    HPI Glennon MacKaron L Baetz is a 33 y.o. male with a past medical history of asthma, legally blind in the right eye, presents today for evaluation of shortness of breath.  He reports that he has been having intermittent asthma exacerbations over the past 3 days that have been progressively worsening.  He was seen here on 7/31 and given a 5-day burst of prednisone however he reports that after the prednisone finished his symptoms worsened.  He states that he is used his albuterol inhaler at home 7 or 8 times today without significant relief.  He reports occasional feelings of tightness in his chest which he says is consistent with his asthma.  He does not have a history of PE, denies leg swelling, hemoptysis.  He states that when he was seen here previously he tested negative for coronavirus.  He reports that he is previously required BiPAP for asthma however has never required intubation.  No fevers or cough.  He reports that his nebulizer machine is not helping with his symptoms either.     HPI  Past Medical History:  Diagnosis Date  . Asthma   . Legal blindness    right eye only     Patient Active Problem List   Diagnosis Date Noted  . Moderate asthma with exacerbation   . 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 Asthma exacerbation 07/09/2016  . Sepsis (HCC) 07/09/2016  . Streptococcal sore throat 07/09/2016  . PNEUMONIA, RIGHT LOWER LOBE 02/01/2008  . ASTHMA 02/01/2008    Past Surgical History:  Procedure Laterality Date  . EYE SURGERY          Home Medications    Prior to Admission medications   Medication Sig Start Date End Date Taking? Authorizing Provider  albuterol (PROVENTIL HFA;VENTOLIN HFA) 108 (90 Base) MCG/ACT inhaler Inhale 2 puffs into the lungs every 6 (six)  hours as needed for wheezing or shortness of breath. 09/21/16  Yes Mabe, Latanya MaudlinMartha L, MD  albuterol (PROVENTIL) (2.5 MG/3ML) 0.083% nebulizer solution Take 3 mLs (2.5 mg total) by nebulization every 6 (six) hours as needed for wheezing or shortness of breath. Patient not taking: Reported on 05/22/2019 10/04/17   Benjiman CorePickering, Nathan, MD    Family History Family History  Problem Relation Age of Onset  . Vitiligo Mother     Social History Social History   Tobacco Use  . Smoking status: Former Smoker    Quit date: 07/02/2016    Years since quitting: 2.8  . Smokeless tobacco: Never Used  Substance Use Topics  . Alcohol use: No  . Drug use: No     Allergies   Patient has no known allergies.   Review of Systems Review of Systems  Constitutional: Negative for chills and fever.  Respiratory: Positive for chest tightness, shortness of breath and wheezing. Negative for choking.   Cardiovascular: Negative for chest pain, palpitations and leg swelling.  Gastrointestinal: Negative for abdominal pain, diarrhea, nausea and vomiting.  Neurological: Negative for weakness and headaches.  All other systems reviewed and are negative.    Physical Exam Updated Vital Signs BP 120/84 (BP Location: Right Arm)   Pulse (!) 102   Temp 99.5 F (37.5 C) (Oral)   Resp (!) 26   SpO2 94%   Physical Exam  Vitals signs and nursing note reviewed.  Constitutional:      General: He is not in acute distress.    Appearance: He is well-developed. He is not diaphoretic.  HENT:     Head: Normocephalic and atraumatic.     Mouth/Throat:     Mouth: Mucous membranes are moist.  Eyes:     General: No scleral icterus.       Right eye: No discharge.        Left eye: No discharge.     Conjunctiva/sclera: Conjunctivae normal.  Neck:     Musculoskeletal: Normal range of motion and neck supple.  Cardiovascular:     Rate and Rhythm: Normal rate and regular rhythm.  Pulmonary:     Effort: Accessory muscle usage  (Mild) present. No respiratory distress.     Breath sounds: No stridor. Examination of the right-upper field reveals wheezing. Examination of the left-upper field reveals wheezing. Examination of the right-middle field reveals wheezing. Examination of the left-middle field reveals wheezing. Examination of the right-lower field reveals decreased breath sounds and wheezing. Examination of the left-lower field reveals decreased breath sounds and wheezing. Decreased breath sounds and wheezing present. No rhonchi or rales.  Chest:     Chest wall: No tenderness.  Abdominal:     General: There is no distension.  Musculoskeletal:        General: No deformity.     Right lower leg: He exhibits no tenderness. No edema.     Left lower leg: He exhibits no tenderness. No edema.  Skin:    General: Skin is warm and dry.  Neurological:     General: No focal deficit present.     Mental Status: He is alert.     Motor: No abnormal muscle tone.  Psychiatric:        Mood and Affect: Mood normal.        Behavior: Behavior normal.      ED Treatments / Results  Labs (all labs ordered are listed, but only abnormal results are displayed) Labs Reviewed  CBC WITH DIFFERENTIAL/PLATELET - Abnormal; Notable for the following components:      Result Value   Lymphs Abs 5.0 (*)    Eosinophils Absolute 0.6 (*)    All other components within normal limits  SARS CORONAVIRUS 2 (HOSPITAL ORDER, [place] LAB)  BASIC METABOLIC PANEL    EKG None  Radiology Dg Chest Portable 1 View  Result Date: 05/22/2019 CLINICAL DATA:  Asthma flare up. EXAM: PORTABLE CHEST 1 VIEW COMPARISON:  September 22, 2017 FINDINGS: Cardiomediastinal silhouette is normal. Mediastinal contours appear intact. There is no evidence of focal airspace consolidation, pleural effusion or pneumothorax. Osseous structures are without acute abnormality. Soft tissues are grossly normal. IMPRESSION: No active disease. Electronically  Signed   By: Fidela Salisbury M.D.   On: 05/22/2019 17:52    Procedures Procedures (including critical care time)  Medications Ordered in ED Medications  magnesium sulfate IVPB 2 g 50 mL (2 g Intravenous New Bag/Given 05/22/19 1841)  methylPREDNISolone sodium succinate (SOLU-MEDROL) 125 mg/2 mL injection 125 mg (125 mg Intravenous Given 05/22/19 1841)  albuterol (VENTOLIN HFA) 108 (90 Base) MCG/ACT inhaler 6 puff (6 puffs Inhalation Given 05/22/19 1814)     Initial Impression / Assessment and Plan / ED Course  I have reviewed the triage vital signs and the nursing notes.  Pertinent labs & imaging results that were available during my care of the patient were reviewed by me and  considered in my medical decision making (see chart for details).       Patient presents today for evaluation of asthma exacerbation.  He denies any fevers or known coronavirus contact.  He states that he has been using his albuterol inhaler at home approximately 7 times today without significant relief.  On arrival he had significant wheezing with decreased breath sounds in the bilateral lower lobes.  X-ray was obtained without evidence of consolidation or pneumothorax.  Based on current pandemic discouraging use of nebulizers he is treated with albuterol, he was given 6 puffs of albuterol.  Given his history along with failure of his albuterol at home to resolve his symptoms he is also given IV Solu-Medrol and magnesium.  Basic labs were ordered along with in-house coronavirus test due to possibility of admission.  At shift change [facility] who will follow pending studies, re-evaulate and determine disposition.      Final Clinical Impressions(s) / ED Diagnoses   Final diagnoses:  Exacerbation of asthma, unspecified asthma severity, unspecified whether persistent    ED Discharge Orders    None       Norman ClayHammond, Kjuan Seipp W, PA-C 05/22/19 [id number]    Margarita Grizzleay, Danielle, MD 05/22/19  2330

## 2019-05-22 NOTE — Discharge Instructions (Signed)
Take steroids for the next 5 days Use albuterol as needed for shortness of breath and wheezing Return if worsening

## 2019-06-01 ENCOUNTER — Encounter (HOSPITAL_COMMUNITY): Payer: Self-pay | Admitting: Emergency Medicine

## 2019-06-01 ENCOUNTER — Other Ambulatory Visit: Payer: Self-pay

## 2019-06-01 ENCOUNTER — Emergency Department (HOSPITAL_COMMUNITY)
Admission: EM | Admit: 2019-06-01 | Discharge: 2019-06-01 | Disposition: A | Discharge status: Home / Self Care | Payer: Medicaid - Out of State | Attending: Emergency Medicine | Admitting: Emergency Medicine

## 2019-06-01 DIAGNOSIS — R062 Wheezing: Secondary | ICD-10-CM | POA: Diagnosis present

## 2019-06-01 DIAGNOSIS — J4541 Moderate persistent asthma with (acute) exacerbation: Secondary | ICD-10-CM | POA: Insufficient documentation

## 2019-06-01 DIAGNOSIS — Z87891 Personal history of nicotine dependence: Secondary | ICD-10-CM | POA: Insufficient documentation

## 2019-06-01 MED ORDER — AEROCHAMBER PLUS FLO-VU LARGE MISC
Status: AC
  Filled 2019-06-01: qty 1

## 2019-06-01 MED ORDER — LEVOCETIRIZINE DIHYDROCHLORIDE 5 MG PO TABS: 5.0000 mg | ORAL_TABLET | Freq: Every evening | ORAL | 0 refills | Status: AC

## 2019-06-01 MED ORDER — IPRATROPIUM BROMIDE HFA 17 MCG/ACT IN AERS
2.0000 | INHALATION_SPRAY | Freq: Once | RESPIRATORY_TRACT | Status: AC
  Administered 2019-06-01: 13:00:00 2 via RESPIRATORY_TRACT
  Filled 2019-06-01: qty 12.9

## 2019-06-01 MED ORDER — ALBUTEROL SULFATE HFA 108 (90 BASE) MCG/ACT IN AERS
8.0000 | INHALATION_SPRAY | RESPIRATORY_TRACT | Status: DC | PRN
  Administered 2019-06-01: 8 via RESPIRATORY_TRACT
  Filled 2019-06-01: qty 6.7

## 2019-06-01 MED ORDER — METHYLPREDNISOLONE 4 MG PO TBPK: ORAL_TABLET | ORAL | 0 refills | Status: DC

## 2019-06-01 MED ORDER — PREDNISONE 20 MG PO TABS
60.0000 mg | ORAL_TABLET | Freq: Once | ORAL | Status: AC
  Administered 2019-06-01: 60 mg via ORAL
  Filled 2019-06-01: qty 3

## 2019-06-01 MED ORDER — AEROCHAMBER PLUS FLO-VU LARGE MISC
1.0000 | Freq: Once | Status: AC
  Administered 2019-06-01: 1

## 2019-06-01 NOTE — ED Provider Notes (Signed)
[facility] EMERGENCY DEPARTMENT Provider Note   CSN: [id number] Arrival date & time: 06/01/19  1009     History   Chief Complaint Chief Complaint  Patient presents with  . Asthma    HPI EXANDER SHAUL is a 33 y.o. male who has a past medical history of asthma and allergies.  The patient states that this is his third visit to the ER since moving to [place] from [place].  Patient states that his asthma has never been this bad before and he thinks it is due to the allergens.  The patient onset of wheezing and shortness of breath beginning earlier this morning.  It was unrelieved with his MDI.  The patient just established relationship with a primary care physician but does not have any symptom control medications.  He has not been taking any other medications.  He denies smoking, cough, fever, chills.     HPI  Past Medical History:  Diagnosis Date  . Asthma   . Legal blindness    right eye only     Patient Active Problem List   Diagnosis Date Noted  . Moderate asthma with exacerbation   . 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 Asthma exacerbation 07/09/2016  . Sepsis ([facility]) 07/09/2016  . Streptococcal sore throat 07/09/2016  . PNEUMONIA, RIGHT LOWER LOBE 02/01/2008  . ASTHMA 02/01/2008    Past Surgical History:  Procedure Laterality Date  . EYE SURGERY          Home Medications    Prior to Admission medications   Medication Sig Start Date End Date Taking? Authorizing Provider  albuterol (PROVENTIL HFA;VENTOLIN HFA) 108 (90 Base) MCG/ACT inhaler Inhale 2 puffs into the lungs every 6 (six) hours as needed for wheezing or shortness of breath. 09/21/16   Mabe, Forbes Cellar, MD  albuterol (PROVENTIL) (2.5 MG/3ML) 0.083% nebulizer solution Take 3 mLs (2.5 mg total) by nebulization every 6 (six) hours as needed for wheezing or shortness of breath. 05/22/19   Recardo Evangelist, PA-C  predniSONE (DELTASONE) 50 MG tablet Take 1 tablet (50 mg total) by mouth daily.  05/22/19   Recardo Evangelist, PA-C    Family History Family History  Problem Relation Age of Onset  . Vitiligo Mother     Social History Social History   Tobacco Use  . Smoking status: Former Smoker    Quit date: 07/02/2016    Years since quitting: 2.9  . Smokeless tobacco: Never Used  Substance Use Topics  . Alcohol use: No  . Drug use: No     Allergies   Patient has no known allergies.   Review of Systems Review of Systems Ten systems reviewed and are negative for acute change, except as noted in the HPI.    Physical Exam Updated Vital Signs BP (!) 137/96   Pulse 72   Temp 98.4 F (36.9 C) (Oral)   Resp 18   Ht 5\' 7"  (1.702 m)   Wt 86.2 kg   SpO2 98%   BMI 29.76 kg/m   Physical Exam Vitals signs and nursing note reviewed.  Constitutional:      General: He is not in acute distress.    Appearance: He is well-developed. He is not diaphoretic.  HENT:     Head: Normocephalic and atraumatic.  Eyes:     General: No scleral icterus.    Conjunctiva/sclera: Conjunctivae normal.  Neck:     Musculoskeletal: Normal range of motion and neck supple.  Cardiovascular:  Rate and Rhythm: Normal rate and regular rhythm.     Heart sounds: Normal heart sounds.  Pulmonary:     Effort: No respiratory distress.     Breath sounds: Wheezing present.     Comments: Patient with labored breathing but able to speak in full sentences.  Air movement is poor.  He has prolonged expiratory phase with diffuse inspiratory and expiratory wheezing. Abdominal:     Palpations: Abdomen is soft.     Tenderness: There is no abdominal tenderness.  Skin:    General: Skin is warm and dry.  Neurological:     Mental Status: He is alert.  Psychiatric:        Behavior: Behavior normal.      ED Treatments / Results  Labs (all labs ordered are listed, but only abnormal results are displayed) Labs Reviewed - No data to display  EKG None  Radiology No results found.  Procedures  Procedures (including critical care time)  Medications Ordered in ED Medications  predniSONE (DELTASONE) tablet 60 mg (has no administration in time range)  albuterol (VENTOLIN HFA) 108 (90 Base) MCG/ACT inhaler 8 puff (has no administration in time range)  ipratropium (ATROVENT HFA) inhaler 2 puff (has no administration in time range)  AeroChamber Plus Flo-Vu Large MISC 1 each (has no administration in time range)     Initial Impression / Assessment and Plan / ED Course  I have reviewed the triage vital signs and the nursing notes.  Pertinent labs & imaging results that were available during my care of the patient were reviewed by me and considered in my medical decision making (see chart for details).         Lung exam improved after nebulizer treatment. Prednisone given in the ED and pt will bd dc with 5 day burst. Pt states they are breathing at baseline. Pt has been instructed to continue using prescribed medications and to speak with PCP about today's exacerbation.    Final Clinical Impressions(s) / ED Diagnoses   Final diagnoses:  Moderate persistent asthma with exacerbation    ED Discharge Orders    None       Arthor CaptainHarris, Mishal Probert, PA-C 06/01/19 1422    Geoffery Lyonselo, Douglas, MD 06/02/19 1302

## 2019-06-01 NOTE — ED Triage Notes (Signed)
Pt reports his asthma is acting up on him again. Pt reports his medicine is not working.

## 2019-06-01 NOTE — Discharge Instructions (Addendum)
Get help right away if: You are getting worse and do not respond to treatment during an asthma attack. You are short of breath when at rest or when doing very little physical activity. You have difficulty eating, drinking, or talking. You have chest pain or tightness. You develop a fast heartbeat or palpitations. You have a bluish color to your lips or fingernails. You are light-headed or dizzy, or you faint. Your peak flow reading is less than 50% of your personal best. You feel too tired to breathe normally. 

## 2019-06-01 NOTE — ED Notes (Signed)
Patient verbalizes understanding of discharge instructions. Opportunity for questioning and answers were provided. Armband removed by staff, pt discharged from ED home via POV.  

## 2019-06-08 ENCOUNTER — Emergency Department (HOSPITAL_COMMUNITY)
Admission: EM | Admit: 2019-06-08 | Discharge: 2019-06-08 | Disposition: A | Discharge status: Home / Self Care | Payer: Medicaid - Out of State | Attending: Emergency Medicine | Admitting: Emergency Medicine

## 2019-06-08 ENCOUNTER — Other Ambulatory Visit: Payer: Self-pay

## 2019-06-08 ENCOUNTER — Encounter (HOSPITAL_COMMUNITY): Payer: Self-pay | Admitting: Emergency Medicine

## 2019-06-08 DIAGNOSIS — Z87891 Personal history of nicotine dependence: Secondary | ICD-10-CM | POA: Insufficient documentation

## 2019-06-08 DIAGNOSIS — J45901 Unspecified asthma with (acute) exacerbation: Secondary | ICD-10-CM | POA: Insufficient documentation

## 2019-06-08 DIAGNOSIS — R0602 Shortness of breath: Secondary | ICD-10-CM | POA: Insufficient documentation

## 2019-06-08 DIAGNOSIS — R0789 Other chest pain: Secondary | ICD-10-CM | POA: Diagnosis present

## 2019-06-08 MED ORDER — PREDNISONE 50 MG PO TABS: ORAL_TABLET | ORAL | 0 refills | Status: DC

## 2019-06-08 MED ORDER — DEXAMETHASONE SODIUM PHOSPHATE 10 MG/ML IJ SOLN
10.0000 mg | Freq: Once | INTRAMUSCULAR | Status: AC
  Administered 2019-06-08: 15:00:00 10 mg via INTRAMUSCULAR
  Filled 2019-06-08: qty 1

## 2019-06-08 MED ORDER — IPRATROPIUM-ALBUTEROL 0.5-2.5 (3) MG/3ML IN SOLN: 3.0000 mL | Freq: Four times a day (QID) | RESPIRATORY_TRACT | 0 refills | Status: AC | PRN

## 2019-06-08 MED ORDER — ALBUTEROL SULFATE (5 MG/ML) 0.5% IN NEBU: 2.5000 mg | INHALATION_SOLUTION | Freq: Four times a day (QID) | RESPIRATORY_TRACT | 12 refills | Status: AC | PRN

## 2019-06-08 NOTE — ED Triage Notes (Signed)
Patient is from home and come to the hospital via EMS with complaints of shortness of breath. He used his inhaler at home with no relief. When EMS arrived the fire department had given him a 5mg  albuterol nebulizer. EMS then followed with a 5mg  duoneb. EMS noticed improvement with wheezing only in the lower lung bases.     EMS vitals: 97% O2 sat on room air 90 HR 116/76 BP 98.2 Temp

## 2019-06-08 NOTE — ED Provider Notes (Signed)
[facility]-EMERGENCY DEPT Provider Note   CSN: [id number] Arrival date & time: 06/08/19  1232     History   Chief Complaint Chief Complaint  Patient presents with  . Shortness of Breath       HPI    Blood pressure 128/81, pulse 86, temperature 98.8 F (37.1 C), temperature source Oral, resp. rate 18, height 5\' 7"  (1.702 m), weight 86.2 kg, SpO2 98 %.  Ian Terry is a 33 y.o. male complaining of chest tightness and shortness of breath with wheezing onset this morning upon waking.  Has a history of asthma which is required hospitalization intubations.  He is visiting [place], he has been to the emergency room 4 times in the last month.  He came down for a visit for 3 days but has been here longer than expected, he does not have any of his albuterol for his nebulizer at home.  He was seen about a week ago and given a prescription for 4 mg of prednisone which he took and states that that helped a little bit.  He has been tested for [place] twice, most recently on the 18th.  He has been staying at home and has not been exposed to anybody that is sick or has tested positive.  He denies any fevers, chills, cough, myalgia, sore throat, otalgia.  He called EMS this morning and he was given 2 nebulizers with essential relief of all of his symptoms including chest pain or shortness of breath.  States he feels much better.  Past Medical History:  Diagnosis Date  . Asthma   . Legal blindness    right eye only     Patient Active Problem List   Diagnosis Date Noted  . Moderate asthma with exacerbation   . 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 Asthma exacerbation 07/09/2016  . Sepsis ([facility]) 07/09/2016  . Streptococcal sore throat 07/09/2016  . PNEUMONIA, RIGHT LOWER LOBE 02/01/2008  . ASTHMA 02/01/2008    Past Surgical History:  Procedure Laterality Date  . EYE SURGERY          Home Medications    Prior to Admission medications   Medication Sig Start Date End  Date Taking? Authorizing Provider  albuterol (PROVENTIL) (5 MG/ML) 0.5% nebulizer solution Take 0.5 mLs (2.5 mg total) by nebulization every 6 (six) hours as needed for wheezing or shortness of breath. 06/08/19   Kiandra Sanguinetti, Joni Reining, PA-C  ipratropium-albuterol (DUONEB) 0.5-2.5 (3) MG/3ML SOLN Take 3 mLs by nebulization every 6 (six) hours as needed for up to 7 days. 06/08/19 06/15/19  Janki Dike, Joni Reining, PA-C  levocetirizine (XYZAL) 5 MG tablet Take 1 tablet (5 mg total) by mouth every evening. 06/01/19   Arthor Captain, PA-C  methylPREDNISolone (MEDROL DOSEPAK) 4 MG TBPK tablet Use as directed 06/01/19   Arthor Captain, PA-C  predniSONE (DELTASONE) 50 MG tablet Take 1 tablet daily with breakfast 06/09/19   Myeisha Kruser, Joni Reining, PA-C    Family History Family History  Problem Relation Age of Onset  . Vitiligo Mother     Social History Social History   Tobacco Use  . Smoking status: Former Smoker    Quit date: 07/02/2016    Years since quitting: 2.9  . Smokeless tobacco: Never Used  Substance Use Topics  . Alcohol use: No  . Drug use: No     Allergies   Patient has no known allergies.   Review of Systems Review of Systems  A complete review of systems was obtained and all systems  are negative except as noted in the HPI and PMH.    Physical Exam Updated Vital Signs BP 128/81   Pulse 86   Temp 98.8 F (37.1 C) (Oral)   Resp 18   Ht 5\' 7"  (1.702 m)   Wt 86.2 kg   SpO2 98%   BMI 29.76 kg/m   Physical Exam Vitals signs and nursing note reviewed.  Constitutional:      General: He is not in acute distress.    Appearance: He is well-developed. He is not diaphoretic.  HENT:     Head: Normocephalic.  Eyes:     Conjunctiva/sclera: Conjunctivae normal.  Neck:     Musculoskeletal: Normal range of motion.     Vascular: No JVD.     Trachea: No tracheal deviation.  Cardiovascular:     Rate and Rhythm: Normal rate and regular rhythm.     Comments: Radial pulse equal bilaterally  Pulmonary:     Effort: Pulmonary effort is normal. No respiratory distress.     Breath sounds: Normal breath sounds. No stridor. No wheezing or rales.     Comments: Comfortable, speaking in complete sentences, trace scattered expiratory wheezing bilaterally, good air movement in all fields Chest:     Chest wall: No tenderness.  Abdominal:     General: There is no distension.     Palpations: Abdomen is soft. There is no mass.     Tenderness: There is no abdominal tenderness. There is no guarding or rebound.  Musculoskeletal: Normal range of motion.        General: No tenderness.     Comments: No calf asymmetry, superficial collaterals, palpable cords, edema, Homans sign negative bilaterally.    Skin:    General: Skin is warm.  Neurological:     Mental Status: He is alert and oriented to person, place, and time.      ED Treatments / Results  Labs (all labs ordered are listed, but only abnormal results are displayed) Labs Reviewed - No data to display  EKG None  Radiology No results found.  Procedures Procedures (including critical care time)  Medications Ordered in ED Medications  dexamethasone (DECADRON) injection 10 mg (has no administration in time range)     Initial Impression / Assessment and Plan / ED Course  I have reviewed the triage vital signs and the nursing notes.  Pertinent labs & imaging results that were available during my care of the patient were reviewed by me and considered in my medical decision making (see chart for details).        Vitals:   06/08/19 1325 06/08/19 1326  BP: 128/81   Pulse: 86   Resp: 18   Temp: 98.8 F (37.1 C)   TempSrc: Oral   SpO2: 98%   Weight:  86.2 kg  Height:  5\' 7"  (1.702 m)    Medications  dexamethasone (DECADRON) injection 10 mg (has no administration in time range)    Ian Terry is 33 y.o. male presenting with chest pain and wheezing onset this morning, he got to nebulizers via EMS and states that  he feels much better.  Patient without respiratory distress, he is got some mild scattered expiratory wheezing.  Afebrile, nontoxic-appearing, no recent sick contacts.  He tested negative for COVID on the 18th.  Patient will be given Decadron IM, prednisone burst starting tomorrow, will refill his nebulizer ampules for albuterol and DuoNeb's.  Return for any worsening symptoms.  This is a shared visit with the  attending physician who personally evaluated the patient and agrees with the care plan.   Evaluation does not show pathology that would require ongoing emergent intervention or inpatient treatment. Pt is hemodynamically stable and mentating appropriately. Discussed findings and plan with patient/guardian, who agrees with care plan. All questions answered. Return precautions discussed and outpatient follow up given.      Final Clinical Impressions(s) / ED Diagnoses   Final diagnoses:  Exacerbation of asthma, unspecified asthma severity, unspecified whether persistent    ED Discharge Orders         Ordered    predniSONE (DELTASONE) 50 MG tablet  Status:  Discontinued     06/08/19 1347    predniSONE (DELTASONE) 50 MG tablet     06/08/19 1353    albuterol (PROVENTIL) (5 MG/ML) 0.5% nebulizer solution  Every 6 hours PRN     06/08/19 1347    ipratropium-albuterol (DUONEB) 0.5-2.5 (3) MG/3ML SOLN  Every 6 hours PRN     06/08/19 1347           Keeleigh Terris, Charna Elizabeth 06/08/19 1358    Maudie Flakes, MD 06/10/19 [phone]

## 2019-11-28 ENCOUNTER — Emergency Department (HOSPITAL_COMMUNITY)
Admission: EM | Admit: 2019-11-28 | Discharge: 2019-11-28 | Disposition: A | Discharge status: Home / Self Care | Payer: Medicaid - Out of State | Attending: Emergency Medicine | Admitting: Emergency Medicine

## 2019-11-28 ENCOUNTER — Other Ambulatory Visit: Payer: Self-pay

## 2019-11-28 ENCOUNTER — Emergency Department (HOSPITAL_COMMUNITY): Payer: Medicaid - Out of State

## 2019-11-28 ENCOUNTER — Encounter (HOSPITAL_COMMUNITY): Payer: Self-pay | Admitting: Emergency Medicine

## 2019-11-28 DIAGNOSIS — J45901 Unspecified asthma with (acute) exacerbation: Secondary | ICD-10-CM | POA: Insufficient documentation

## 2019-11-28 DIAGNOSIS — R0602 Shortness of breath: Secondary | ICD-10-CM | POA: Diagnosis present

## 2019-11-28 MED ORDER — PREDNISONE 50 MG PO TABS: 50.0000 mg | ORAL_TABLET | Freq: Every day | ORAL | 0 refills | Status: DC

## 2019-11-28 MED ORDER — PREDNISONE 20 MG PO TABS
60.0000 mg | ORAL_TABLET | ORAL | Status: AC
  Administered 2019-11-28: 17:00:00 60 mg via ORAL
  Filled 2019-11-28: qty 3

## 2019-11-28 MED ORDER — IPRATROPIUM-ALBUTEROL 0.5-2.5 (3) MG/3ML IN SOLN
3.0000 mL | Freq: Once | RESPIRATORY_TRACT | Status: AC
  Administered 2019-11-28: 17:00:00 3 mL via RESPIRATORY_TRACT
  Filled 2019-11-28: qty 3

## 2019-11-28 MED ORDER — ALBUTEROL SULFATE HFA 108 (90 BASE) MCG/ACT IN AERS: 2.0000 | INHALATION_SPRAY | RESPIRATORY_TRACT | 1 refills | Status: AC | PRN

## 2019-11-28 NOTE — ED Notes (Signed)
Pt transported to XR.  

## 2019-11-28 NOTE — ED Triage Notes (Signed)
Pt c/o shortness of breath, hx asthma, states home inhalers aren't working.

## 2019-11-28 NOTE — ED Provider Notes (Signed)
[facility] - [facility] EMERGENCY DEPARTMENT Provider Note   CSN: [id number] Arrival date & time: 11/28/19  1454     History Chief Complaint  Patient presents with  . Shortness of Breath    Ian Terry is a 34 y.o. male.  HPI Patient presents to the emergency department with an asthma exacerbation.  The patient states that he has run out of his inhaler and states that his asthma seems to be getting worse.  The patient states that it started yesterday while he was laying at home.  The patient states that he has had no fever, cough, nausea, vomiting, weakness, dizziness, headache, blurred vision, sore throat, nasal congestion, body aches or syncope.  Patient denies any chest pain as well.    Past Medical History:  Diagnosis Date  . Asthma   . Legal blindness    right eye only     Patient Active Problem List   Diagnosis Date Noted  . Moderate asthma with exacerbation   . 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 Asthma exacerbation 07/09/2016  . Sepsis ([facility]) 07/09/2016  . Streptococcal sore throat 07/09/2016  . PNEUMONIA, RIGHT LOWER LOBE 02/01/2008  . ASTHMA 02/01/2008    Past Surgical History:  Procedure Laterality Date  . EYE SURGERY         Family History  Problem Relation Age of Onset  . Vitiligo Mother     Social History   Tobacco Use  . Smoking status: Former Smoker    Quit date: 07/02/2016    Years since quitting: 3.4  . Smokeless tobacco: Never Used  Substance Use Topics  . Alcohol use: No  . Drug use: No    Home Medications Prior to Admission medications   Medication Sig Start Date End Date Taking? Authorizing Provider  albuterol (PROVENTIL) (5 MG/ML) 0.5% nebulizer solution Take 0.5 mLs (2.5 mg total) by nebulization every 6 (six) hours as needed for wheezing or shortness of breath. 06/08/19   Pisciotta, Joni Reining, PA-C  ipratropium-albuterol (DUONEB) 0.5-2.5 (3) MG/3ML SOLN Take 3 mLs by nebulization every 6 (six) hours as needed for up to 7 days.  06/08/19 06/15/19  Pisciotta, Joni Reining, PA-C  levocetirizine (XYZAL) 5 MG tablet Take 1 tablet (5 mg total) by mouth every evening. 06/01/19   Arthor Captain, PA-C  methylPREDNISolone (MEDROL DOSEPAK) 4 MG TBPK tablet Use as directed 06/01/19   Arthor Captain, PA-C  predniSONE (DELTASONE) 50 MG tablet Take 1 tablet daily with breakfast 06/09/19   Pisciotta, Joni Reining, PA-C    Allergies    Patient has no known allergies.  Review of Systems   Review of Systems All other systems negative except as documented in the HPI. All pertinent positives and negatives as reviewed in the HPI. Physical Exam Updated Vital Signs BP (!) 135/97 (BP Location: Left Arm)   Pulse 79   Temp 98.4 F (36.9 C) (Oral)   Resp 16   SpO2 97%   Physical Exam Vitals and nursing note reviewed.  Constitutional:      General: He is not in acute distress.    Appearance: He is well-developed.  HENT:     Head: Normocephalic and atraumatic.  Eyes:     Pupils: Pupils are equal, round, and reactive to light.  Cardiovascular:     Rate and Rhythm: Normal rate and regular rhythm.     Heart sounds: Normal heart sounds. No murmur. No friction rub. No gallop.   Pulmonary:     Effort: Pulmonary effort is normal. No respiratory distress.  Breath sounds: Examination of the right-upper field reveals wheezing. Examination of the left-upper field reveals wheezing. Examination of the right-middle field reveals wheezing. Examination of the left-middle field reveals wheezing. Examination of the right-lower field reveals wheezing. Examination of the left-lower field reveals wheezing. Wheezing present. No decreased breath sounds, rhonchi or rales.  Abdominal:     General: There is no distension.  Musculoskeletal:     Cervical back: Normal range of motion and neck supple.  Skin:    General: Skin is warm and dry.     Capillary Refill: Capillary refill takes less than 2 seconds.     Findings: No erythema or rash.  Neurological:     Mental  Status: He is alert and oriented to person, place, and time.     Motor: No abnormal muscle tone.     Coordination: Coordination normal.  Psychiatric:        Behavior: Behavior normal.     ED Results / Procedures / Treatments   Labs (all labs ordered are listed, but only abnormal results are displayed) Labs Reviewed - No data to display  EKG None  Radiology DG Chest 2 View  Result Date: 11/28/2019 CLINICAL DATA:  Asthma, short of breath EXAM: CHEST - 2 VIEW COMPARISON:  05/22/2019 FINDINGS: The heart size and mediastinal contours are within normal limits. Both lungs are clear. The visualized skeletal structures are unremarkable. IMPRESSION: No active cardiopulmonary disease. Electronically Signed   By: Randa Ngo M.D.   On: 11/28/2019 16:03    Procedures Procedures (including critical care time)  Medications Ordered in ED Medications  predniSONE (DELTASONE) tablet 60 mg (60 mg Oral Given 11/28/19 1644)  ipratropium-albuterol (DUONEB) 0.5-2.5 (3) MG/3ML nebulizer solution 3 mL (3 mLs Nebulization Given 11/28/19 1645)    ED Course  I have reviewed the triage vital signs and the nursing notes.  Pertinent labs & imaging results that were available during my care of the patient were reviewed by me and considered in my medical decision making (see chart for details).    MDM Rules/Calculators/A&P                      Patient was given a DuoNeb treatment along with steroids.  The patient is reassessed and on lung auscultation has no further wheezing.  The patient reports feeling better.  He has maintained good oxygen saturations and show no significant abnormalities of his vital signs.  Patient will be discharged home and given steroids for short course along with albuterol inhaler.  Have advised that he will need to follow-up with the primary doctor soon as possible. Final Clinical Impression(s) / ED Diagnoses Final diagnoses:  None    Rx / DC Orders ED Discharge Orders     None       Dalia Heading, PA-C 11/28/19 1750    Charlesetta Shanks, MD 11/29/19 1701

## 2019-11-28 NOTE — ED Notes (Signed)
Pt. returned from XR. 

## 2019-11-28 NOTE — Discharge Instructions (Signed)
Return here as needed.  You will need to follow-up with your primary doctor soon as possible.

## 2020-07-18 ENCOUNTER — Emergency Department (HOSPITAL_COMMUNITY): Payer: Medicaid - Out of State

## 2020-07-18 ENCOUNTER — Other Ambulatory Visit: Payer: Self-pay

## 2020-07-18 ENCOUNTER — Emergency Department (HOSPITAL_COMMUNITY)
Admission: EM | Admit: 2020-07-18 | Discharge: 2020-07-18 | Disposition: A | Discharge status: Home / Self Care | Payer: Medicaid - Out of State | Attending: Emergency Medicine | Admitting: Emergency Medicine

## 2020-07-18 ENCOUNTER — Encounter (HOSPITAL_COMMUNITY): Payer: Self-pay

## 2020-07-18 DIAGNOSIS — Z87891 Personal history of nicotine dependence: Secondary | ICD-10-CM | POA: Insufficient documentation

## 2020-07-18 DIAGNOSIS — Z79899 Other long term (current) drug therapy: Secondary | ICD-10-CM | POA: Diagnosis not present

## 2020-07-18 DIAGNOSIS — R059 Cough, unspecified: Secondary | ICD-10-CM | POA: Diagnosis present

## 2020-07-18 DIAGNOSIS — Z20822 Contact with and (suspected) exposure to covid-19: Secondary | ICD-10-CM | POA: Insufficient documentation

## 2020-07-18 DIAGNOSIS — J4541 Moderate persistent asthma with (acute) exacerbation: Secondary | ICD-10-CM | POA: Insufficient documentation

## 2020-07-18 DIAGNOSIS — J069 Acute upper respiratory infection, unspecified: Secondary | ICD-10-CM | POA: Insufficient documentation

## 2020-07-18 LAB — RESPIRATORY PANEL BY RT PCR (FLU A&B, COVID)
Influenza A by PCR: NEGATIVE
Influenza B by PCR: NEGATIVE
SARS Coronavirus 2 by RT PCR: NEGATIVE

## 2020-07-18 MED ORDER — PREDNISONE 20 MG PO TABS
60.0000 mg | ORAL_TABLET | Freq: Once | ORAL | Status: AC
  Administered 2020-07-18: 60 mg via ORAL
  Filled 2020-07-18: qty 3

## 2020-07-18 MED ORDER — ALBUTEROL SULFATE HFA 108 (90 BASE) MCG/ACT IN AERS
1.0000 | INHALATION_SPRAY | Freq: Once | RESPIRATORY_TRACT | Status: DC
  Filled 2020-07-18: qty 6.7

## 2020-07-18 MED ORDER — PREDNISONE 20 MG PO TABS: 40.0000 mg | ORAL_TABLET | Freq: Every day | ORAL | 0 refills | Status: AC

## 2020-07-18 MED ORDER — ALBUTEROL (5 MG/ML) CONTINUOUS INHALATION SOLN
10.0000 mg/h | INHALATION_SOLUTION | RESPIRATORY_TRACT | Status: DC
  Administered 2020-07-18: 10 mg/h via RESPIRATORY_TRACT
  Filled 2020-07-18: qty 20

## 2020-07-18 MED ORDER — IPRATROPIUM-ALBUTEROL 0.5-2.5 (3) MG/3ML IN SOLN
3.0000 mL | Freq: Once | RESPIRATORY_TRACT | Status: AC
  Administered 2020-07-18: 3 mL via RESPIRATORY_TRACT
  Filled 2020-07-18: qty 3

## 2020-07-18 NOTE — ED Provider Notes (Signed)
[facility] EMERGENCY DEPARTMENT Provider Note   CSN: [id number] Arrival date & time: 07/18/20  1057     History No chief complaint on file.   Ian Terry is a 34 y.o. male with past medical history of asthma presents emergency department today for asthma exacerbation and URI-like symptoms.  Patient states that he started having cough with wheezing and myalgias for the past 2 days.  No fevers, chills.  Cough is nonproductive, no hemoptysis.  States that he is been using his nebulizer more than normal which has not really been helping.  States that his chest feels tight, no chest pain, feels similar to previous asthma exacerbations.  Has been vaccinated against Covid.  Denies any sick contacts.  No fevers, nausea, vomiting, sore throat, congestion.  States that he was in normal health before this.  Is actively seeing a pulmonologist in [place] where he lives, just started some type of asthma injection that he started last week, most likely immunotherapy.  Patient states that he came here to visit, however is going back to [place] tomorrow.  Denies any long flights more than 2 hours, recent surgery, history of DVT or PE, coagulation disorder.  Patient states he is generally healthy besides his asthma which he has been trying to control.  HPI     Past Medical History:  Diagnosis Date   Asthma    Legal blindness    right eye only     Patient Active Problem List   Diagnosis Date Noted   Moderate asthma with exacerbation    Legally blind 10/01/2016   Asthma exacerbation 07/09/2016   Sepsis (HCC) 07/09/2016   Streptococcal sore throat 07/09/2016   PNEUMONIA, RIGHT LOWER LOBE 02/01/2008   ASTHMA 02/01/2008    Past Surgical History:  Procedure Laterality Date   EYE SURGERY         Family History  Problem Relation Age of Onset   Vitiligo Mother     Social History   Tobacco Use   Smoking status: Former Smoker    Quit date: 07/02/2016      Years since quitting: 4.0   Smokeless tobacco: Never Used  Substance Use Topics   Alcohol use: No   Drug use: No    Home Medications Prior to Admission medications   Medication Sig Start Date End Date Taking? Authorizing Provider  albuterol (PROVENTIL) (5 MG/ML) 0.5% nebulizer solution Take 0.5 mLs (2.5 mg total) by nebulization every 6 (six) hours as needed for wheezing or shortness of breath. 06/08/19   Pisciotta, Joni Reining, PA-C  albuterol (VENTOLIN HFA) 108 (90 Base) MCG/ACT inhaler Inhale 2 puffs into the lungs every 4 (four) hours as needed for wheezing or shortness of breath. 11/28/19   Lawyer, Cristal Deer, PA-C  ipratropium-albuterol (DUONEB) 0.5-2.5 (3) MG/3ML SOLN Take 3 mLs by nebulization every 6 (six) hours as needed for up to 7 days. 06/08/19 06/15/19  Pisciotta, Joni Reining, PA-C  levocetirizine (XYZAL) 5 MG tablet Take 1 tablet (5 mg total) by mouth every evening. 06/01/19   Arthor Captain, PA-C  methylPREDNISolone (MEDROL DOSEPAK) 4 MG TBPK tablet Use as directed 06/01/19   Arthor Captain, PA-C  predniSONE (DELTASONE) 20 MG tablet Take 2 tablets (40 mg total) by mouth daily for 4 days. 07/18/20 07/22/20  Farrel Gordon, PA-C    Allergies    Patient has no known allergies.  Review of Systems   Review of Systems  Constitutional: Negative for chills, diaphoresis, fatigue and fever.  HENT: Negative for congestion, dental  problem, ear discharge, facial swelling, hearing loss, rhinorrhea, sinus pressure, sinus pain, sneezing, sore throat and trouble swallowing.   Eyes: Negative for pain and visual disturbance.  Respiratory: Positive for cough and wheezing. Negative for shortness of breath.   Cardiovascular: Negative for chest pain, palpitations and leg swelling.  Gastrointestinal: Negative for abdominal distention, abdominal pain, diarrhea, nausea and vomiting.  Genitourinary: Negative for difficulty urinating.  Musculoskeletal: Positive for arthralgias and myalgias. Negative for back  pain, neck pain and neck stiffness.  Skin: Negative for pallor.  Neurological: Negative for dizziness, speech difficulty, weakness and headaches.  Psychiatric/Behavioral: Negative for confusion.    Physical Exam Updated Vital Signs BP 103/83    Pulse 86    Temp 98.7 F (37.1 C) (Oral)    Resp (!) 22    Ht 5\' 7"  (1.702 m)    Wt 90.7 kg    SpO2 98%    BMI 31.32 kg/m   Physical Exam Constitutional:      General: He is not in acute distress.    Appearance: Normal appearance. He is not ill-appearing, toxic-appearing or diaphoretic.     Comments: Patient without acute respiratory stress.  Patient is sitting comfortably in bed, no tripoding, use of accessory muscles.  Patient is speaking to me in full sentences.  Handling secretions well.  HENT:     Head: Normocephalic and atraumatic.     Jaw: There is normal jaw occlusion. No trismus, swelling or malocclusion.     Nose: No congestion or rhinorrhea.     Right Sinus: No maxillary sinus tenderness or frontal sinus tenderness.     Left Sinus: No maxillary sinus tenderness or frontal sinus tenderness.     Mouth/Throat:     Mouth: Mucous membranes are moist. No oral lesions.     Dentition: Normal dentition.     Tongue: No lesions.     Palate: No mass and lesions.     Pharynx: Oropharynx is clear. Uvula midline. No pharyngeal swelling, oropharyngeal exudate, posterior oropharyngeal erythema or uvula swelling.     Tonsils: No tonsillar exudate or tonsillar abscesses. 1+ on the right. 1+ on the left.  Eyes:     General: No visual field deficit.       Right eye: No discharge.        Left eye: No discharge.     Extraocular Movements: Extraocular movements intact.     Conjunctiva/sclera: Conjunctivae normal.     Pupils: Pupils are equal, round, and reactive to light.  Cardiovascular:     Rate and Rhythm: Normal rate and regular rhythm.     Pulses: Normal pulses.     Heart sounds: Normal heart sounds. No murmur heard.  No friction rub. No  gallop.   Pulmonary:     Effort: Pulmonary effort is normal. No respiratory distress.     Breath sounds: No stridor. Wheezing (Diffuse expiratory wheezes heard throughout lung bases) present. No rhonchi or rales.  Chest:     Chest wall: No tenderness.  Abdominal:     General: Abdomen is flat. Bowel sounds are normal. There is no distension.     Palpations: Abdomen is soft.     Tenderness: There is no abdominal tenderness. There is no right CVA tenderness or left CVA tenderness.  Musculoskeletal:        General: No swelling or tenderness. Normal range of motion.     Cervical back: Normal range of motion. No rigidity or tenderness.     Right lower leg:  No edema.     Left lower leg: No edema.  Lymphadenopathy:     Cervical: No cervical adenopathy.  Skin:    General: Skin is warm and dry.     Capillary Refill: Capillary refill takes less than 2 seconds.     Findings: No erythema or rash.  Neurological:     General: No focal deficit present.     Mental Status: He is alert and oriented to person, place, and time.     Cranial Nerves: Cranial nerves are intact. No cranial nerve deficit or facial asymmetry.     Motor: Motor function is intact. No weakness.     Coordination: Coordination is intact.     Gait: Gait is intact. Gait normal.  Psychiatric:        Mood and Affect: Mood normal.     ED Results / Procedures / Treatments   Labs (all labs ordered are listed, but only abnormal results are displayed) Labs Reviewed  RESPIRATORY PANEL BY RT PCR (FLU A&B, COVID)    EKG EKG Interpretation  Date/Time:  Tuesday July 18 2020 15:40:22 EDT Ventricular Rate:  75 PR Interval:  146 QRS Duration: 86 QT Interval:  372 QTC Calculation: 415 R Axis:   39 Text Interpretation: Normal sinus rhythm ST elevation, consider early repolarization Borderline ECG No significant change since last tracing Confirmed by Benjiman Core [phone]) on 07/18/2020 6:00:56 PM   Radiology DG Chest 1  View  Result Date: 07/18/2020 CLINICAL DATA:  Shortness of breath EXAM: CHEST  1 VIEW COMPARISON:  November 28, 2019 FINDINGS: The cardiomediastinal silhouette is normal in contour. No pleural effusion. No pneumothorax. No acute pleuroparenchymal abnormality. Visualized abdomen is unremarkable. No acute osseous abnormality noted. IMPRESSION: No acute cardiopulmonary abnormality. Electronically Signed   By: Meda Klinefelter MD   On: 07/18/2020 12:57    Procedures Procedures (including critical care time)  Medications Ordered in ED Medications  albuterol (PROVENTIL,VENTOLIN) solution continuous neb (10 mg/hr Nebulization New Bag/Given 07/18/20 1636)  albuterol (VENTOLIN HFA) 108 (90 Base) MCG/ACT inhaler 1 puff (has no administration in time range)  ipratropium-albuterol (DUONEB) 0.5-2.5 (3) MG/3ML nebulizer solution 3 mL (3 mLs Nebulization Given 07/18/20 1547)  predniSONE (DELTASONE) tablet 60 mg (60 mg Oral Given 07/18/20 1620)    ED Course  I have reviewed the triage vital signs and the nursing notes.  Pertinent labs & imaging results that were available during my care of the patient were reviewed by me and considered in my medical decision making (see chart for details).    MDM Rules/Calculators/A&P                          Ian Terry is a 34 y.o. male with past medical history of asthma presents emergency department today for asthma exacerbation.  Patient does not appear to be in respiratory distress, lung exam with diffuse expiratory wheezes throughout. Covid test negative.  Chest x-ray without any acute cardiopulmonary disease interpreted by me. PERC negative. Appears well.   Patient has now received a DuoNeb, states that he feels a little bit better, however on repeat lung exam patient still having diffuse wheezes and states that he still has tight chest.  Will initiate continuous albuterol nebulizer at this time and prednisone.   Upon reevaluation, patient states that he feels  much better with continuous neb and prednisone on board.  He is ready to leave, much improved when he came in.  Patient will follow  up with pulmon[place] in [place] since he is going there tomorrow.  Patient most likely has a URI which exacerbated asthma, discussed symptomatic treatment in depth with patient.  Did also discuss strict return precautions including worsening shortness of breath.  Doubt need for further emergent work up at this time. I explained the diagnosis and have given explicit precautions to return to the ER including for any other new or worsening symptoms. The patient understands and accepts the medical plan as it's been dictated and I have answered their questions. Discharge instructions concerning home care and prescriptions have been given. The patient is STABLE and is discharged to home in good condition.  Final Clinical Impression(s) / ED Diagnoses Final diagnoses:  Viral upper respiratory tract infection  Moderate persistent asthma with exacerbation    Rx / DC Orders ED Discharge Orders         Ordered    predniSONE (DELTASONE) 20 MG tablet  Daily        07/18/20 1741           Farrel Gordonatel, Recardo Linn, PA-C 07/18/20 1805    Sabas SousBero, Michael M, MD 07/20/20 2255

## 2020-07-18 NOTE — ED Notes (Signed)
Pt noted to be wheezing, EDP notified via secure chat.

## 2020-07-18 NOTE — ED Triage Notes (Signed)
Patient complains of cough with wheezing and body aches x 3 days. Patient states that he is using neb more than normal/ alert and oriented, NAD. Has been vaccinated

## 2020-07-18 NOTE — Discharge Instructions (Signed)
Your work-up today was reassuring.  Your Covid test was negative, as we discussed you could still have COVID.  Stay isolated, get plenty of rest and drink plenty of water.  Continue to use your albuterol as we discussed.  Take the prednisone over the next couple of days.  Please follow-up with your pulmonologist about your asthma as we discussed.  Please come back to the emerge department for any new worsening concerning symptoms. Use the attached instructions.

## 2021-02-10 ENCOUNTER — Other Ambulatory Visit: Payer: Self-pay

## 2021-02-10 ENCOUNTER — Emergency Department (HOSPITAL_COMMUNITY)
Admission: EM | Admit: 2021-02-10 | Discharge: 2021-02-10 | Disposition: A | Discharge status: Home / Self Care | Payer: Medicaid Other | Attending: Emergency Medicine | Admitting: Emergency Medicine

## 2021-02-10 DIAGNOSIS — J45901 Unspecified asthma with (acute) exacerbation: Secondary | ICD-10-CM | POA: Diagnosis not present

## 2021-02-10 DIAGNOSIS — R0602 Shortness of breath: Secondary | ICD-10-CM | POA: Diagnosis present

## 2021-02-10 DIAGNOSIS — Z87891 Personal history of nicotine dependence: Secondary | ICD-10-CM | POA: Insufficient documentation

## 2021-02-10 MED ORDER — ALBUTEROL SULFATE HFA 108 (90 BASE) MCG/ACT IN AERS: 2.0000 | INHALATION_SPRAY | RESPIRATORY_TRACT | 1 refills | Status: AC | PRN

## 2021-02-10 MED ORDER — IPRATROPIUM-ALBUTEROL 0.5-2.5 (3) MG/3ML IN SOLN
3.0000 mL | Freq: Once | RESPIRATORY_TRACT | Status: AC
  Administered 2021-02-10: 3 mL via RESPIRATORY_TRACT
  Filled 2021-02-10: qty 3

## 2021-02-10 MED ORDER — ALBUTEROL SULFATE HFA 108 (90 BASE) MCG/ACT IN AERS
2.0000 | INHALATION_SPRAY | Freq: Once | RESPIRATORY_TRACT | Status: AC
  Administered 2021-02-10: 2 via RESPIRATORY_TRACT
  Filled 2021-02-10: qty 6.7

## 2021-02-10 NOTE — ED Provider Notes (Signed)
[facility]EMERGENCY DEPT [facility] Emergency Department Provider Note MRN:  [id number]  Arrival date & time: 02/10/21     Chief Complaint   Asthma   History of Present Illness   Ian Terry is a 35 y.o. year-old male with a history of asthma presenting to the ED with chief complaint of asthma.  Wheezing, shortness of breath, chest tightness since yesterday.  Ran out of his home medications.  Denies any nausea or vomiting, no headache or vision change, no abdominal pain.  Symptoms constant, mild to moderate, no exacerbating or alleviating factors.  Review of Systems  A complete 10 system review of systems was obtained and all systems are negative except as noted in the HPI and PMH.   Patient's Health History    Past Medical History:  Diagnosis Date  . Asthma   . Legal blindness    right eye only     Past Surgical History:  Procedure Laterality Date  . EYE SURGERY      Family History  Problem Relation Age of Onset  . Vitiligo Mother     Social History   Socioeconomic History  . Marital status: Single    Spouse name: Not on file  . Number of children: Not on file  . Years of education: Not on file  . Highest education level: Not on file  Occupational History  . Not on file  Tobacco Use  . Smoking status: Former Smoker    Quit date: 07/02/2016    Years since quitting: 4.6  . Smokeless tobacco: Never Used  Substance and Sexual Activity  . Alcohol use: No  . Drug use: No  . Sexual activity: Not on file  Other Topics Concern  . Not on file  Social History Narrative  . Not on file   Social Determinants of Health   Financial Resource Strain: Not on file  Food Insecurity: Not on file  Transportation Needs: Not on file  Physical Activity: Not on file  Stress: Not on file  Social Connections: Not on file  Intimate Partner Violence: Not on file     Physical Exam   Vitals:   02/10/21 0445 02/10/21 0452  BP: 118/73   Pulse: 70   Resp: 14 16  Temp:     SpO2: 95%     CONSTITUTIONAL: Well-appearing, NAD NEURO:  Alert and oriented x 3, no focal deficits EYES:  eyes equal and reactive ENT/NECK:  no LAD, no JVD CARDIO: Regular rate, well-perfused, normal S1 and S2 PULM: Faint scattered wheezes GI/GU:  normal bowel sounds, non-distended, non-tender MSK/SPINE:  No gross deformities, no edema SKIN:  no rash, atraumatic PSYCH:  Appropriate speech and behavior  *Additional and/or pertinent findings included in MDM below  Diagnostic and Interventional Summary    EKG Interpretation  Date/Time:  Saturday February 10 2021 02:37:29 EDT Ventricular Rate:  65 PR Interval:  142 QRS Duration: 88 QT Interval:  416 QTC Calculation: 432 R Axis:   8 Text Interpretation: Normal sinus rhythm with sinus arrhythmia Nonspecific T wave abnormality Abnormal ECG Confirmed by Kennis Carina [phone]) on 02/10/2021 4:44:11 AM      Labs Reviewed - No data to display  No orders to display    Medications  albuterol (VENTOLIN HFA) 108 (90 Base) MCG/ACT inhaler 2 puff (2 puffs Inhalation Given 02/10/21 0237)  ipratropium-albuterol (DUONEB) 0.5-2.5 (3) MG/3ML nebulizer solution 3 mL (3 mLs Nebulization Given 02/10/21 0452)     Procedures  /  Critical Care Procedures  ED  Course and Medical Decision Making  I have reviewed the triage vital signs, the nursing notes, and pertinent available records from the EMR.  Listed above are laboratory and imaging tests that I personally ordered, reviewed, and interpreted and then considered in my medical decision making (see below for details).  On my exam patient is sleeping comfortably.  When I wake him, he explains that he has been having some wheezing.  Does have wheezing on exam.  Having some chest tightness, suspect related to reactive airway disease.  EKG is reassuring.  Providing DuoNeb, replacing home inhaler, appropriate for discharge.       Elmer Sow. Pilar Plate, MD [facility]  Health [email or web address]  Final Clinical Impressions(s) / ED Diagnoses     ICD-10-CM   1. Exacerbation of asthma, unspecified asthma severity, unspecified whether persistent  J45.901     ED Discharge Orders         Ordered    albuterol (VENTOLIN HFA) 108 (90 Base) MCG/ACT inhaler  Every 4 hours PRN        02/10/21 0530           Discharge Instructions Discussed with and Provided to Patient:     Discharge Instructions     You were evaluated in the Emergency Department and after careful evaluation, we did not find any emergent condition requiring admission or further testing in the hospital.  Your exam/testing today was overall reassuring.  Please take your inhaler at home and follow-up with your regular doctors.  Please return to the Emergency Department if you experience any worsening of your condition.  Thank you for allowing [place] to be a part of your care.       Sabas Sous, MD 02/10/21 [phone]

## 2021-02-10 NOTE — Discharge Instructions (Addendum)
You were evaluated in the Emergency Department and after careful evaluation, we did not find any emergent condition requiring admission or further testing in the hospital.  Your exam/testing today was overall reassuring.  Please take your inhaler at home and follow-up with your regular doctors.  Please return to the Emergency Department if you experience any worsening of your condition.  Thank you for allowing [place] to be a part of your care.

## 2021-02-10 NOTE — ED Triage Notes (Signed)
Pt c/o asthma exacerbation (wheezing and SOB) since yesterday morning.

## 2021-02-10 NOTE — ED Notes (Signed)
E-signature pad unavailable at time of pt discharge. This RN discussed discharge materials with pt and answered all pt questions. Pt stated understanding of discharge material. ? ?

## 2021-05-18 ENCOUNTER — Emergency Department (HOSPITAL_COMMUNITY)
Admission: EM | Admit: 2021-05-18 | Discharge: 2021-05-19 | Disposition: A | Discharge status: Home / Self Care | Payer: Medicaid Other | Attending: Emergency Medicine | Admitting: Emergency Medicine

## 2021-05-18 DIAGNOSIS — B349 Viral infection, unspecified: Secondary | ICD-10-CM | POA: Diagnosis not present

## 2021-05-18 DIAGNOSIS — J9801 Acute bronchospasm: Secondary | ICD-10-CM | POA: Insufficient documentation

## 2021-05-18 DIAGNOSIS — Z20822 Contact with and (suspected) exposure to covid-19: Secondary | ICD-10-CM | POA: Diagnosis not present

## 2021-05-18 DIAGNOSIS — Z87891 Personal history of nicotine dependence: Secondary | ICD-10-CM | POA: Diagnosis not present

## 2021-05-18 DIAGNOSIS — R059 Cough, unspecified: Secondary | ICD-10-CM | POA: Diagnosis present

## 2021-05-18 NOTE — ED Notes (Signed)
Called at this time but no answer.  

## 2021-05-19 ENCOUNTER — Other Ambulatory Visit: Payer: Self-pay

## 2021-05-19 ENCOUNTER — Encounter (HOSPITAL_COMMUNITY): Payer: Self-pay

## 2021-05-19 ENCOUNTER — Emergency Department (HOSPITAL_COMMUNITY): Payer: Medicaid Other

## 2021-05-19 LAB — SARS CORONAVIRUS 2 (TAT 6-24 HRS): SARS Coronavirus 2: NEGATIVE

## 2021-05-19 MED ORDER — PREDNISONE 20 MG PO TABS
60.0000 mg | ORAL_TABLET | Freq: Once | ORAL | Status: AC
  Administered 2021-05-19: 60 mg via ORAL
  Filled 2021-05-19: qty 3

## 2021-05-19 MED ORDER — PREDNISONE 20 MG PO TABS: 40.0000 mg | ORAL_TABLET | Freq: Every day | ORAL | 0 refills | Status: AC

## 2021-05-19 MED ORDER — ACETAMINOPHEN 325 MG PO TABS
650.0000 mg | ORAL_TABLET | Freq: Once | ORAL | Status: AC
  Administered 2021-05-19: 650 mg via ORAL
  Filled 2021-05-19: qty 2

## 2021-05-19 MED ORDER — IPRATROPIUM BROMIDE 0.02 % IN SOLN
0.5000 mg | Freq: Once | RESPIRATORY_TRACT | Status: AC
  Administered 2021-05-19: 0.5 mg via RESPIRATORY_TRACT
  Filled 2021-05-19: qty 2.5

## 2021-05-19 MED ORDER — ALBUTEROL SULFATE (2.5 MG/3ML) 0.083% IN NEBU
5.0000 mg | INHALATION_SOLUTION | Freq: Once | RESPIRATORY_TRACT | Status: AC
  Administered 2021-05-19: 5 mg via RESPIRATORY_TRACT
  Filled 2021-05-19: qty 6

## 2021-05-19 NOTE — ED Triage Notes (Signed)
Pt states he's been having fever, chills, loss of taste and smell x 2 days.

## 2021-05-19 NOTE — ED Provider Notes (Signed)
[facility] EMERGENCY DEPARTMENT Provider Note   CSN: [id number] Arrival date & time: 05/18/21  2040     History No chief complaint on file.   Ian Terry is a 35 y.o. male.  35 year old male with a history of asthma presents to the emergency department for URI symptoms x2 days.  Reports congestion as well as cough and increased wheezing.  Does have a history of asthma and has been using his albuterol inhalers more frequently than normal.  Complains of associated subjective fever, chills, loss of taste and smell as well as anorexia and fatigue.  Has had decreased food and fluid intake, but has been able to eat something today.  Tried ibuprofen for fever at symptom onset, but was not able to tolerate it.  Expresses concern for COVID-19 illness.  The history is provided by the patient. No language interpreter was used.      Past Medical History:  Diagnosis Date   Asthma    Legal blindness    right eye only     Patient Active Problem List   Diagnosis Date Noted   Moderate asthma with exacerbation    Legally blind 10/01/2016   Asthma exacerbation 07/09/2016   Sepsis (HCC) 07/09/2016   Streptococcal sore throat 07/09/2016   PNEUMONIA, RIGHT LOWER LOBE 02/01/2008   ASTHMA 02/01/2008    Past Surgical History:  Procedure Laterality Date   EYE SURGERY         Family History  Problem Relation Age of Onset   Vitiligo Mother     Social History   Tobacco Use   Smoking status: Former    Types: Cigarettes    Quit date: 07/02/2016    Years since quitting: 4.8   Smokeless tobacco: Never  Substance Use Topics   Alcohol use: No   Drug use: No    Home Medications Prior to Admission medications   Medication Sig Start Date End Date Taking? Authorizing Provider  predniSONE (DELTASONE) 20 MG tablet Take 2 tablets (40 mg total) by mouth daily. 05/19/21  Yes Antony Madura, PA-C  albuterol (PROVENTIL) (5 MG/ML) 0.5% nebulizer solution Take 0.5 mLs (2.5 mg total)  by nebulization every 6 (six) hours as needed for wheezing or shortness of breath. 06/08/19   Pisciotta, Joni Reining, PA-C  albuterol (VENTOLIN HFA) 108 (90 Base) MCG/ACT inhaler Inhale 2 puffs into the lungs every 4 (four) hours as needed for wheezing or shortness of breath. 11/28/19   Lawyer, Cristal Deer, PA-C  albuterol (VENTOLIN HFA) 108 (90 Base) MCG/ACT inhaler Inhale 2 puffs into the lungs every 4 (four) hours as needed for wheezing or shortness of breath. 02/10/21   Sabas Sous, MD  ipratropium-albuterol (DUONEB) 0.5-2.5 (3) MG/3ML SOLN Take 3 mLs by nebulization every 6 (six) hours as needed for up to 7 days. 06/08/19 06/15/19  Pisciotta, Joni Reining, PA-C  levocetirizine (XYZAL) 5 MG tablet Take 1 tablet (5 mg total) by mouth every evening. 06/01/19   Arthor Captain, PA-C    Allergies    Patient has no known allergies.  Review of Systems   Review of Systems Ten systems reviewed and are negative for acute change, except as noted in the HPI.    Physical Exam Updated Vital Signs BP 125/86   Pulse 100   Temp 99.4 F (37.4 C) (Oral)   Resp (!) 23   SpO2 100%   Physical Exam Vitals and nursing note reviewed.  Constitutional:      General: He is not in acute distress.  Appearance: He is well-developed. He is not diaphoretic.     Comments: Nontoxic appearing and in NAD  HENT:     Head: Normocephalic and atraumatic.     Nose: Congestion (mild) present. No rhinorrhea.  Eyes:     General: No scleral icterus.    Conjunctiva/sclera: Conjunctivae normal.  Cardiovascular:     Rate and Rhythm: Normal rate and regular rhythm.     Pulses: Normal pulses.  Pulmonary:     Effort: Pulmonary effort is normal. No respiratory distress.     Breath sounds: No stridor. Wheezing (diffuse expiratory) present.  Musculoskeletal:        General: Normal range of motion.     Cervical back: Normal range of motion.  Skin:    General: Skin is warm and dry.     Coloration: Skin is not pale.     Findings: No  erythema or rash.  Neurological:     Mental Status: He is alert and oriented to person, place, and time.     Coordination: Coordination normal.  Psychiatric:        Behavior: Behavior normal.    ED Results / Procedures / Treatments   Labs (all labs ordered are listed, but only abnormal results are displayed) Labs Reviewed  SARS CORONAVIRUS 2 (TAT 6-24 HRS)    EKG None  Radiology DG Chest Portable 1 View  Result Date: 05/19/2021 CLINICAL DATA:  Cough EXAM: PORTABLE CHEST 1 VIEW COMPARISON:  07/18/2020 FINDINGS: The heart size and mediastinal contours are within normal limits. Both lungs are clear. The visualized skeletal structures are unremarkable. IMPRESSION: No active disease. Electronically Signed   By: Deatra Robinson M.D.   On: 05/19/2021 01:59    Procedures Procedures   Medications Ordered in ED Medications  acetaminophen (TYLENOL) tablet 650 mg (650 mg Oral Given 05/19/21 0132)  predniSONE (DELTASONE) tablet 60 mg (60 mg Oral Given 05/19/21 0304)  albuterol (PROVENTIL) (2.5 MG/3ML) 0.083% nebulizer solution 5 mg (5 mg Nebulization Given 05/19/21 0305)  ipratropium (ATROVENT) nebulizer solution 0.5 mg (0.5 mg Nebulization Given 05/19/21 0305)    ED Course  I have reviewed the triage vital signs and the nursing notes.  Pertinent labs & imaging results that were available during my care of the patient were reviewed by me and considered in my medical decision making (see chart for details).  Clinical Course as of 05/19/21 0356  Sat May 19, 2021  3664 Patient states that he is feeling much better.  His lung sounds have improved following the nebulizer treatment.  Will discharge on 5 days of prednisone. [KH]    Clinical Course User Index [KH] Antony Madura, PA-C   MDM Rules/Calculators/A&P                           CXR negative for acute infiltrate. Patient's symptoms are consistent with URI, likely viral etiology. Discussed that antibiotics are not indicated for viral  infections. Patient will be discharged with symptomatic treatment.  He verbalizes understanding and is agreeable with plan. Patient is hemodynamically stable and in NAD prior to discharge.  Ian Terry was evaluated in Emergency Department on 05/19/2021 for the symptoms described in the history of present illness. He was evaluated in the context of the global COVID-19 pandemic, which necessitated consideration that the patient might be at risk for infection with the SARS-CoV-2 virus that causes COVID-19. Institutional protocols and algorithms that pertain to the evaluation of patients at risk for  COVID-19 are in a state of rapid change based on information released by regulatory bodies including the CDC and federal and state organizations. These policies and algorithms were followed during the patient's care in the ED.   Final Clinical Impression(s) / ED Diagnoses Final diagnoses:  Suspected COVID-19 virus infection  Acute bronchospasm due to viral infection    Rx / DC Orders ED Discharge Orders          Ordered    predniSONE (DELTASONE) 20 MG tablet  Daily        05/19/21 0346             Antony Madura, PA-C 05/19/21 0357    Sabas Sous, MD 05/19/21 [phone]

## 2021-05-19 NOTE — Discharge Instructions (Addendum)
Take prednisone as prescribed until finished. Continue use of your albuterol inhaler; 2 puffs every 4-6 hours for wheezing and shortness of breath.   You have a COVID test pending. You can access these results through MyChart. If positive, quarantine away from other individuals for at least 5 days. Take tylenol for fever, headaches, body aches. Drink plenty of fluids to prevent dehydration. You may continue to use other over-the-counter remedies for symptom control, if desired. Return for new or concerning symptoms such as worsening shortness of breath, coughing up blood, persistent vomiting, loss of consciousness.

## 2023-09-29 ENCOUNTER — Emergency Department (HOSPITAL_COMMUNITY)
Admission: EM | Admit: 2023-09-29 | Discharge: 2023-09-30 | Disposition: A | Discharge status: Home / Self Care | Payer: Self-pay | Attending: Emergency Medicine | Admitting: Emergency Medicine

## 2023-09-29 ENCOUNTER — Encounter (HOSPITAL_COMMUNITY): Payer: Self-pay

## 2023-09-29 ENCOUNTER — Emergency Department (HOSPITAL_COMMUNITY): Payer: Self-pay

## 2023-09-29 DIAGNOSIS — J101 Influenza due to other identified influenza virus with other respiratory manifestations: Secondary | ICD-10-CM | POA: Insufficient documentation

## 2023-09-29 DIAGNOSIS — J45909 Unspecified asthma, uncomplicated: Secondary | ICD-10-CM | POA: Insufficient documentation

## 2023-09-29 DIAGNOSIS — R000 Tachycardia, unspecified: Secondary | ICD-10-CM | POA: Insufficient documentation

## 2023-09-29 DIAGNOSIS — D72829 Elevated white blood cell count, unspecified: Secondary | ICD-10-CM | POA: Insufficient documentation

## 2023-09-29 DIAGNOSIS — Z20822 Contact with and (suspected) exposure to covid-19: Secondary | ICD-10-CM | POA: Insufficient documentation

## 2023-09-29 DIAGNOSIS — E876 Hypokalemia: Secondary | ICD-10-CM | POA: Insufficient documentation

## 2023-09-29 LAB — COMPREHENSIVE METABOLIC PANEL
ALT: 16 U/L (ref 0–44)
AST: 19 U/L (ref 15–41)
Albumin: 3.8 g/dL (ref 3.5–5.0)
Alkaline Phosphatase: 48 U/L (ref 38–126)
Anion gap: 8 (ref 5–15)
BUN: 21 mg/dL — ABNORMAL HIGH (ref 6–20)
CO2: 27 mmol/L (ref 22–32)
Calcium: 8.6 mg/dL — ABNORMAL LOW (ref 8.9–10.3)
Chloride: 103 mmol/L (ref 98–111)
Creatinine, Ser: 1.14 mg/dL (ref 0.61–1.24)
GFR, Estimated: >60 mL/min (ref >60)
Glucose, Bld: 90 mg/dL (ref 70–99)
Potassium: 3.2 mmol/L — ABNORMAL LOW (ref 3.5–5.1)
Sodium: 138 mmol/L (ref 135–145)
Total Bilirubin: 0.4 mg/dL (ref <1.2)
Total Protein: 6.7 g/dL (ref 6.5–8.1)

## 2023-09-29 LAB — CBC WITH DIFFERENTIAL/PLATELET
Abs Immature Granulocytes: 0.07 10*3/uL (ref 0.00–0.07)
Basophils Absolute: 0 10*3/uL (ref 0.0–0.1)
Basophils Relative: 0 %
Eosinophils Absolute: 0 10*3/uL (ref 0.0–0.5)
Eosinophils Relative: 0 %
HCT: 41.1 % (ref 39.0–52.0)
Hemoglobin: 13.5 g/dL (ref 13.0–17.0)
Immature Granulocytes: 1 %
Lymphocytes Relative: 24 %
Lymphs Abs: 3.6 10*3/uL (ref 0.7–4.0)
MCH: 29.7 pg (ref 26.0–34.0)
MCHC: 32.8 g/dL (ref 30.0–36.0)
MCV: 90.5 fL (ref 80.0–100.0)
Monocytes Absolute: 1.8 10*3/uL — ABNORMAL HIGH (ref 0.1–1.0)
Monocytes Relative: 12 %
Neutro Abs: 9.2 10*3/uL — ABNORMAL HIGH (ref 1.7–7.7)
Neutrophils Relative %: 63 %
Platelets: 281 10*3/uL (ref 150–400)
RBC: 4.54 MIL/uL (ref 4.22–5.81)
RDW: 12 % (ref 11.5–15.5)
WBC: 14.7 10*3/uL — ABNORMAL HIGH (ref 4.0–10.5)
nRBC: 0 % (ref 0.0–0.2)

## 2023-09-29 LAB — I-STAT CG4 LACTIC ACID, ED: Lactic Acid, Venous: 2.2 mmol/L (ref 0.5–1.9)

## 2023-09-29 LAB — PROTIME-INR
INR: 1 (ref 0.8–1.2)
Prothrombin Time: 13.1 s (ref 11.4–15.2)

## 2023-09-29 LAB — APTT: aPTT: 30 s (ref 24–36)

## 2023-09-29 MED ORDER — SODIUM CHLORIDE 0.9 % IV SOLN
2.0000 g | Freq: Once | INTRAVENOUS | Status: AC
  Administered 2023-09-29: 2 g via INTRAVENOUS
  Filled 2023-09-29: qty 12.5

## 2023-09-29 MED ORDER — SODIUM CHLORIDE 0.9 % IV BOLUS (SEPSIS)
1000.0000 mL | Freq: Once | INTRAVENOUS | Status: AC
  Administered 2023-09-29: 1000 mL via INTRAVENOUS

## 2023-09-29 MED ORDER — IBUPROFEN 800 MG PO TABS
800.0000 mg | ORAL_TABLET | Freq: Once | ORAL | Status: AC
  Administered 2023-09-29: 800 mg via ORAL
  Filled 2023-09-29: qty 1

## 2023-09-29 MED ORDER — DEXAMETHASONE SODIUM PHOSPHATE 10 MG/ML IJ SOLN
10.0000 mg | Freq: Once | INTRAMUSCULAR | Status: AC
  Administered 2023-09-29: 10 mg via INTRAVENOUS
  Filled 2023-09-29 (×2): qty 1

## 2023-09-29 MED ORDER — IPRATROPIUM-ALBUTEROL 0.5-2.5 (3) MG/3ML IN SOLN
3.0000 mL | Freq: Once | RESPIRATORY_TRACT | Status: AC
  Administered 2023-09-29: 3 mL via RESPIRATORY_TRACT
  Filled 2023-09-29 (×2): qty 3

## 2023-09-29 MED ORDER — MAGNESIUM SULFATE 2 GM/50ML IV SOLN
2.0000 g | INTRAVENOUS | Status: AC
  Administered 2023-09-29: 2 g via INTRAVENOUS
  Filled 2023-09-29: qty 50

## 2023-09-29 MED ORDER — VANCOMYCIN HCL IN DEXTROSE 1-5 GM/200ML-% IV SOLN: 1000.0000 mg | Freq: Once | INTRAVENOUS | Status: DC

## 2023-09-29 MED ORDER — VANCOMYCIN HCL IN DEXTROSE 1-5 GM/200ML-% IV SOLN
1000.0000 mg | Freq: Once | INTRAVENOUS | Status: DC
  Administered 2023-09-30: 1000 mg via INTRAVENOUS
  Filled 2023-09-29: qty 200

## 2023-09-29 MED ORDER — METRONIDAZOLE 500 MG/100ML IV SOLN
500.0000 mg | Freq: Once | INTRAVENOUS | Status: AC
  Administered 2023-09-29: 500 mg via INTRAVENOUS
  Filled 2023-09-29: qty 100

## 2023-09-29 MED ORDER — VANCOMYCIN HCL IN DEXTROSE 1-5 GM/200ML-% IV SOLN
1000.0000 mg | Freq: Once | INTRAVENOUS | Status: AC
  Administered 2023-09-29: 1000 mg via INTRAVENOUS
  Filled 2023-09-29: qty 200

## 2023-09-29 MED ORDER — SODIUM CHLORIDE 0.9% FLUSH: 10.0000 mL | Freq: Two times a day (BID) | INTRAVENOUS | Status: DC

## 2023-09-29 NOTE — Sepsis Progress Note (Signed)
Elink monitoring for the code sepsis protocol.  

## 2023-09-29 NOTE — ED Triage Notes (Signed)
Pt arrived POV d/t fever, cough, congestion since yesterday with weakness.

## 2023-09-29 NOTE — ED Provider Notes (Signed)
[facility]-EMERGENCY DEPT [facility] Emergency Department Provider Note MRN:  [id number]  Arrival date & time: 09/30/23     Chief Complaint   Fever   History of Present Illness   Ian Terry is a 37 y.o. year-old male presents to the ED with chief complaint of cough, chest pain, fever, nausea, vomiting, and generalized body aches.  He states that the symptoms have been going on for the past 2 days.  No successful treatments PTA.  He has been traveling.  Hx of asthma.  History provided by patient.   Review of Systems  Pertinent positive and negative review of systems noted in HPI.    Physical Exam   Vitals:   09/30/23 0315 09/30/23 0411  BP: 112/69   Pulse: 98   Resp: 17   Temp:  97.9 F (36.6 C)  SpO2: 93%     CONSTITUTIONAL:  unwell-appearing, diaphoretic NEURO:  Alert and oriented x 3, CN 3-12 grossly intact EYES:  eyes equal and reactive ENT/NECK:  Supple, no stridor  CARDIO:  tachycardic, regular rhythm, appears well-perfused  PULM:  Mildly increased WOB, wheezing throughout GI/GU:  non-distended,  MSK/SPINE:  No gross deformities, no edema, moves all extremities  SKIN:  no rash, atraumatic   *Additional and/or pertinent findings included in MDM below  Diagnostic and Interventional Summary    EKG Interpretation Date/Time:  Monday September 29 2023 21:00:40 EST Ventricular Rate:  114 PR Interval:  134 QRS Duration:  94 QT Interval:  304 QTC Calculation: 419 R Axis:   59  Text Interpretation: Sinus tachycardia Otherwise normal ECG Confirmed by Zadie Rhine ([id number]) on 09/29/2023 11:08:18 PM       Labs Reviewed  RESP PANEL BY RT-PCR (RSV, FLU A&B, COVID)  RVPGX2 - Abnormal; Notable for the following components:      Result Value   Influenza A by PCR POSITIVE (*)    All other components within normal limits  COMPREHENSIVE METABOLIC PANEL - Abnormal; Notable for the following components:   Potassium 3.2 (*)    BUN 21 (*)    Calcium 8.6 (*)     All other components within normal limits  CBC WITH DIFFERENTIAL/PLATELET - Abnormal; Notable for the following components:   WBC 14.7 (*)    Neutro Abs 9.2 (*)    Monocytes Absolute 1.8 (*)    All other components within normal limits  I-STAT CG4 LACTIC ACID, ED - Abnormal; Notable for the following components:   Lactic Acid, Venous 2.2 (*)    All other components within normal limits  CULTURE, BLOOD (ROUTINE X 2)  CULTURE, BLOOD (ROUTINE X 2)  PROTIME-INR  APTT  URINALYSIS, W/ REFLEX TO CULTURE (INFECTION SUSPECTED)  I-STAT CG4 LACTIC ACID, ED    DG Chest Port 1 View  Final Result      Medications  sodium chloride flush (NS) 0.9 % injection 10 mL (10 mLs Intravenous Not Given 09/29/23 2246)  albuterol (PROVENTIL,VENTOLIN) solution continuous neb (0 mg/hr Nebulization Stopped 09/30/23 0411)  albuterol (VENTOLIN HFA) 108 (90 Base) MCG/ACT inhaler 2 puff (2 puffs Inhalation Given 09/30/23 0411)  ibuprofen (ADVIL) tablet 800 mg (800 mg Oral Given 09/29/23 2153)  ipratropium-albuterol (DUONEB) 0.5-2.5 (3) MG/3ML nebulizer solution 3 mL (3 mLs Nebulization Given 09/29/23 2227)  magnesium sulfate IVPB 2 g 50 mL (0 g Intravenous Stopped 09/29/23 2324)  dexamethasone (DECADRON) injection 10 mg (10 mg Intravenous Given 09/29/23 2223)  sodium chloride 0.9 % bolus 1,000 mL (0 mLs Intravenous Stopped 09/29/23 2341)  And  sodium chloride 0.9 % bolus 1,000 mL (0 mLs Intravenous Stopped 09/29/23 2344)    And  sodium chloride 0.9 % bolus 1,000 mL (0 mLs Intravenous Stopped 09/30/23 0043)  ceFEPIme (MAXIPIME) 2 g in sodium chloride 0.9 % 100 mL IVPB (0 g Intravenous Stopped 09/29/23 2321)  metroNIDAZOLE (FLAGYL) IVPB 500 mg (0 mg Intravenous Stopped 09/30/23 0029)  vancomycin (VANCOCIN) IVPB 1000 mg/200 mL premix (0 mg Intravenous Stopped 09/30/23 0029)  oseltamivir (TAMIFLU) capsule 75 mg (75 mg Oral Given 09/30/23 0243)  albuterol (PROVENTIL) (2.5 MG/3ML) 0.083% nebulizer solution (10 mg  Inhalation Given 09/30/23 0139)  ipratropium-albuterol (DUONEB) 0.5-2.5 (3) MG/3ML nebulizer solution 3 mL (3 mLs Nebulization Given 09/30/23 0409)     Procedures  /  Critical Care .Critical Care  Performed by: Roxy Horseman, PA-C Authorized by: Roxy Horseman, PA-C   Critical care provider statement:    Critical care time (minutes):  43   Critical care was necessary to treat or prevent imminent or life-threatening deterioration of the following conditions:  Sepsis   Critical care was time spent personally by me on the following activities:  Development of treatment plan with patient or surrogate, discussions with consultants, evaluation of patient's response to treatment, examination of patient, ordering and review of laboratory studies, ordering and review of radiographic studies, ordering and performing treatments and interventions, pulse oximetry, re-evaluation of patient's condition and review of old charts   ED Course and Medical Decision Making  I have reviewed the triage vital signs, the nursing notes, and pertinent available records from the EMR.  Social Determinants Affecting Complexity of Care: Patient has no clinically significant social determinants affecting this chief complaint..   ED Course: Clinical Course as of 09/30/23 0426  Mon Sep 29, 2023  2306 Lactic Acid, Venous(!!): 2.2 Elevated, will trend [RB]  2306 WBC(!): 14.7 Meets SIRS, will  [RB]  2308 Comprehensive metabolic panel(!) Mild hypokalemia, normal creatinine  [RB]  Tue Sep 30, 2023  0320 Resp panel by RT-PCR (RSV, Flu A&B, Covid) Anterior Nasal Swab(!) Positive for influenza A, no pneumonia seen, will dc antibiotics and order tamiflu. [RB]    Clinical Course User Index [RB] Roxy Horseman, PA-C    Medical Decision Making Patient here with fever and generalized body aches and cough.  He also has significant wheezing.  Will give nebs.  Mag and solumedrol ordered in triage.  Patient presented  with fever and cough.  Vitals and labs concerning for sepsis.  He was diffusely wheezy, but improved with breathing treatments.  Flu A test was positive.  DC'd antibiotics.   I've reassessed the patient multiple times.  He reports feeling improved, but still sounds wheezy.  I offered him admission, but he declined.  He is probably high risk for returning.  Amount and/or Complexity of Data Reviewed Labs: ordered. Decision-making details documented in ED Course. Radiology: ordered and independent interpretation performed. Decision-making details documented in ED Course.    Details: No large infiltrate or effusion ECG/medicine tests: ordered and independent interpretation performed.    Details: Sinus tachycardia  Risk Prescription drug management. Decision regarding hospitalization.         Consultants: No consultations were needed in caring for this patient.   Treatment and Plan: I considered admission due to patient's initial presentation, but after considering the examination and diagnostic results, patient will not require admission and can be discharged with outpatient follow-up.    Final Clinical Impressions(s) / ED Diagnoses     ICD-10-CM   1.  Influenza A  J10.1       ED Discharge Orders          Ordered    oseltamivir (TAMIFLU) 75 MG capsule  Every 12 hours        09/30/23 0419    benzonatate (TESSALON) 100 MG capsule  2 times daily PRN        09/30/23 0419              Discharge Instructions Discussed with and Provided to Patient:   Discharge Instructions   None      Roxy Horseman, PA-C 09/30/23 0426    Blane Ohara, MD 10/01/23 [phone]

## 2023-09-29 NOTE — Progress Notes (Signed)
ED Pharmacy Antibiotic Sign Off An antibiotic consult was received from an ED provider for cefepime and vancomycin per pharmacy dosing for sepsis. A chart review was completed to assess appropriateness.   The following one time order(s) were placed:  Cefepime 2g  Vancomcyin 2g  Further antibiotic and/or antibiotic pharmacy consults should be ordered by the admitting provider if indicated.   Thank you for allowing pharmacy to be a part of this patient's care.   Marja Kays, [facility]  Clinical Pharmacist 09/29/23 10:34 PM

## 2023-09-29 NOTE — ED Provider Triage Note (Signed)
Emergency Medicine Provider Triage Evaluation Note  IBIN CIPRES , a 37 y.o. male  was evaluated in triage.  Pt complains of fever cough wheezing sob.  Review of Systems  Positive: wheezing Negative: Vomiting   Physical Exam  BP 136/89 (BP Location: Right Arm)   Pulse (!) 107   Temp (!) 102.2 F (39 C) (Oral)   Resp 20   SpO2 98%  Gen:   Ill  Resp:  Tachypnea, poor air movement MSK:   Moves extremities without difficulty  Other:    Medical Decision Making  Medically screening exam initiated at 9:39 PM.  Appropriate orders placed.  KEYTON WINIARSKI was informed that the remainder of the evaluation will be completed by another provider, this initial triage assessment does not replace that evaluation, and the importance of remaining in the ED until their evaluation is complete.     Arthor Captain, PA-C 09/29/23 2148

## 2023-09-30 LAB — I-STAT CG4 LACTIC ACID, ED: Lactic Acid, Venous: 1.8 mmol/L (ref 0.5–1.9)

## 2023-09-30 LAB — RESP PANEL BY RT-PCR (RSV, FLU A&B, COVID)  RVPGX2
Influenza A by PCR: POSITIVE — AB
Influenza B by PCR: NEGATIVE
Resp Syncytial Virus by PCR: NEGATIVE
SARS Coronavirus 2 by RT PCR: NEGATIVE

## 2023-09-30 MED ORDER — OSELTAMIVIR PHOSPHATE 75 MG PO CAPS
75.0000 mg | ORAL_CAPSULE | Freq: Once | ORAL | Status: AC
  Administered 2023-09-30: 75 mg via ORAL
  Filled 2023-09-30: qty 1

## 2023-09-30 MED ORDER — IPRATROPIUM-ALBUTEROL 0.5-2.5 (3) MG/3ML IN SOLN
3.0000 mL | Freq: Once | RESPIRATORY_TRACT | Status: AC
  Administered 2023-09-30: 3 mL via RESPIRATORY_TRACT
  Filled 2023-09-30: qty 3

## 2023-09-30 MED ORDER — ALBUTEROL (5 MG/ML) CONTINUOUS INHALATION SOLN
10.0000 mg/h | INHALATION_SOLUTION | RESPIRATORY_TRACT | Status: AC
  Administered 2023-09-30: 10 mg/h via RESPIRATORY_TRACT
  Filled 2023-09-30: qty 20

## 2023-09-30 MED ORDER — ALBUTEROL SULFATE HFA 108 (90 BASE) MCG/ACT IN AERS
2.0000 | INHALATION_SPRAY | RESPIRATORY_TRACT | Status: DC | PRN
  Administered 2023-09-30: 2 via RESPIRATORY_TRACT
  Filled 2023-09-30: qty 6.7

## 2023-09-30 MED ORDER — BENZONATATE 100 MG PO CAPS: 100.0000 mg | ORAL_CAPSULE | Freq: Two times a day (BID) | ORAL | 0 refills | Status: AC | PRN

## 2023-09-30 MED ORDER — OSELTAMIVIR PHOSPHATE 75 MG PO CAPS: 75.0000 mg | ORAL_CAPSULE | Freq: Two times a day (BID) | ORAL | 0 refills | Status: AC

## 2023-09-30 MED ORDER — ALBUTEROL SULFATE (2.5 MG/3ML) 0.083% IN NEBU
INHALATION_SOLUTION | RESPIRATORY_TRACT | Status: AC
  Administered 2023-09-30: 10 mg via RESPIRATORY_TRACT
  Filled 2023-09-30: qty 12

## 2023-09-30 NOTE — ED Notes (Signed)
Ambulated with pt down and back up the hall with little support. Patients  02 was at 93 and the heart rate of 114.

## 2023-09-30 NOTE — ED Notes (Signed)
Patient verbalizes understanding of discharge instructions. Opportunity for questioning and answers were provided. Armband removed by staff, pt discharged from ED. Ambulated out to lobby with friend

## 2023-10-04 LAB — CULTURE, BLOOD (ROUTINE X 2)
Culture: NO GROWTH
Culture: NO GROWTH
